# Patient Record
Sex: Female | Born: 1990 | Race: Black or African American | Hispanic: No | Marital: Single | Smoking: Current every day smoker
Health system: Southern US, Community
[De-identification: ages and names within clinical notes are randomized; demographics above are authoritative.]

## PROBLEM LIST (undated history)

## (undated) DIAGNOSIS — Z789 Other specified health status: Secondary | ICD-10-CM

## (undated) DIAGNOSIS — D219 Benign neoplasm of connective and other soft tissue, unspecified: Secondary | ICD-10-CM

## (undated) HISTORY — DX: Benign neoplasm of connective and other soft tissue, unspecified: D21.9

## (undated) HISTORY — PX: NO PAST SURGERIES: SHX2092

---
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
## 2008-02-22 ENCOUNTER — Emergency Department: Payer: Self-pay | Admitting: Emergency Medicine

## 2008-08-19 ENCOUNTER — Emergency Department: Payer: Self-pay | Admitting: Unknown Physician Specialty

## 2008-08-20 ENCOUNTER — Emergency Department: Payer: Self-pay | Admitting: Emergency Medicine

## 2009-02-13 ENCOUNTER — Emergency Department: Payer: Self-pay | Admitting: Emergency Medicine

## 2009-02-24 ENCOUNTER — Emergency Department: Payer: Self-pay | Admitting: Emergency Medicine

## 2009-03-27 ENCOUNTER — Emergency Department: Payer: Self-pay | Admitting: Emergency Medicine

## 2009-11-12 ENCOUNTER — Emergency Department: Payer: Self-pay | Admitting: Emergency Medicine

## 2010-03-30 ENCOUNTER — Emergency Department: Payer: Self-pay | Admitting: Unknown Physician Specialty

## 2010-08-28 ENCOUNTER — Emergency Department: Payer: Self-pay | Admitting: Internal Medicine

## 2010-10-22 ENCOUNTER — Emergency Department: Payer: Self-pay | Admitting: Emergency Medicine

## 2010-10-31 ENCOUNTER — Other Ambulatory Visit: Payer: Self-pay | Admitting: Family Medicine

## 2010-10-31 ENCOUNTER — Encounter (INDEPENDENT_AMBULATORY_CARE_PROVIDER_SITE_OTHER): Payer: Self-pay | Admitting: *Deleted

## 2010-10-31 DIAGNOSIS — Z3480 Encounter for supervision of other normal pregnancy, unspecified trimester: Secondary | ICD-10-CM

## 2010-10-31 DIAGNOSIS — O3680X0 Pregnancy with inconclusive fetal viability, not applicable or unspecified: Secondary | ICD-10-CM

## 2010-10-31 DIAGNOSIS — IMO0001 Reserved for inherently not codable concepts without codable children: Secondary | ICD-10-CM

## 2010-10-31 LAB — CONVERTED CEMR LAB
Eosinophils Absolute: 0.1 10*3/uL (ref 0.0–0.7)
Hepatitis B Surface Ag: NEGATIVE
Hgb A2 Quant: 2.5 % (ref 2.2–3.2)
Hgb F Quant: 0 % (ref 0.0–2.0)
Lymphocytes Relative: 21 % (ref 12–46)
Lymphs Abs: 2.1 10*3/uL (ref 0.7–4.0)
MCV: 93.1 fL (ref 78.0–100.0)
Neutro Abs: 7.4 10*3/uL (ref 1.7–7.7)
Neutrophils Relative %: 72 % (ref 43–77)
Platelets: 284 10*3/uL (ref 150–400)
Rh Type: POSITIVE
WBC: 10.2 10*3/uL (ref 4.0–10.5)

## 2010-11-05 ENCOUNTER — Other Ambulatory Visit: Payer: Self-pay | Admitting: Family Medicine

## 2010-11-05 ENCOUNTER — Ambulatory Visit (HOSPITAL_COMMUNITY)
Admission: RE | Admit: 2010-11-05 | Discharge: 2010-11-05 | Disposition: A | Discharge status: Home / Self Care | Payer: Medicaid Other | Source: Ambulatory Visit | Attending: Family Medicine | Admitting: Family Medicine

## 2010-11-05 ENCOUNTER — Encounter (HOSPITAL_COMMUNITY): Payer: Self-pay

## 2010-11-05 DIAGNOSIS — IMO0001 Reserved for inherently not codable concepts without codable children: Secondary | ICD-10-CM

## 2010-11-05 DIAGNOSIS — O3680X0 Pregnancy with inconclusive fetal viability, not applicable or unspecified: Secondary | ICD-10-CM

## 2010-11-05 DIAGNOSIS — Z3689 Encounter for other specified antenatal screening: Secondary | ICD-10-CM | POA: Insufficient documentation

## 2010-11-13 ENCOUNTER — Other Ambulatory Visit: Payer: Self-pay | Admitting: Obstetrics & Gynecology

## 2010-11-13 ENCOUNTER — Encounter: Payer: Self-pay | Admitting: Obstetrics and Gynecology

## 2010-11-13 ENCOUNTER — Other Ambulatory Visit (HOSPITAL_COMMUNITY)
Admission: RE | Admit: 2010-11-13 | Discharge: 2010-11-13 | Disposition: A | Discharge status: Home / Self Care | Payer: Medicaid Other | Source: Ambulatory Visit | Attending: Obstetrics & Gynecology | Admitting: Obstetrics & Gynecology

## 2010-11-13 DIAGNOSIS — Z3480 Encounter for supervision of other normal pregnancy, unspecified trimester: Secondary | ICD-10-CM

## 2010-11-13 DIAGNOSIS — Z3682 Encounter for antenatal screening for nuchal translucency: Secondary | ICD-10-CM

## 2010-11-13 DIAGNOSIS — Z01419 Encounter for gynecological examination (general) (routine) without abnormal findings: Secondary | ICD-10-CM | POA: Insufficient documentation

## 2010-11-13 DIAGNOSIS — Z113 Encounter for screening for infections with a predominantly sexual mode of transmission: Secondary | ICD-10-CM | POA: Insufficient documentation

## 2010-11-14 ENCOUNTER — Encounter: Payer: Self-pay | Admitting: Obstetrics & Gynecology

## 2010-11-14 LAB — CONVERTED CEMR LAB: Trich, Wet Prep: NONE SEEN

## 2010-11-28 ENCOUNTER — Ambulatory Visit (HOSPITAL_COMMUNITY): Payer: Medicaid Other

## 2010-12-11 ENCOUNTER — Encounter: Payer: Self-pay | Admitting: Obstetrics and Gynecology

## 2011-01-29 ENCOUNTER — Emergency Department: Payer: Self-pay | Admitting: Emergency Medicine

## 2011-02-22 ENCOUNTER — Emergency Department: Payer: Self-pay | Admitting: Emergency Medicine

## 2011-09-02 ENCOUNTER — Emergency Department: Payer: Self-pay | Admitting: *Deleted

## 2011-09-03 LAB — URINALYSIS, COMPLETE
Bilirubin,UR: NEGATIVE
Glucose,UR: NEGATIVE mg/dL (ref 0–75)
Ketone: NEGATIVE
Ph: 5 (ref 4.5–8.0)
RBC,UR: 3 /HPF (ref 0–5)
Squamous Epithelial: 4
WBC UR: 2 /HPF (ref 0–5)

## 2012-04-01 ENCOUNTER — Emergency Department: Payer: Self-pay | Admitting: Emergency Medicine

## 2012-04-06 ENCOUNTER — Emergency Department: Payer: Self-pay | Admitting: Emergency Medicine

## 2012-06-11 ENCOUNTER — Emergency Department: Payer: Self-pay | Admitting: Emergency Medicine

## 2012-08-30 ENCOUNTER — Emergency Department: Payer: Self-pay | Admitting: Emergency Medicine

## 2012-09-02 ENCOUNTER — Emergency Department: Payer: Self-pay | Admitting: Unknown Physician Specialty

## 2012-09-07 LAB — WOUND CULTURE

## 2013-07-13 ENCOUNTER — Emergency Department: Payer: Self-pay | Admitting: Emergency Medicine

## 2013-08-22 ENCOUNTER — Emergency Department: Payer: Self-pay | Admitting: Emergency Medicine

## 2013-08-23 LAB — CBC
HCT: 39.6 % (ref 35.0–47.0)
HGB: 13.4 g/dL (ref 12.0–16.0)
MCH: 31.6 pg (ref 26.0–34.0)
MCHC: 33.8 g/dL (ref 32.0–36.0)
MCV: 94 fL (ref 80–100)
Platelet: 284 10*3/uL (ref 150–440)
RBC: 4.23 10*6/uL (ref 3.80–5.20)
RDW: 13.5 % (ref 11.5–14.5)
WBC: 7.2 10*3/uL (ref 3.6–11.0)

## 2013-08-23 LAB — COMPREHENSIVE METABOLIC PANEL
Albumin: 3.7 g/dL (ref 3.4–5.0)
Alkaline Phosphatase: 83 U/L
Anion Gap: 3 — ABNORMAL LOW (ref 7–16)
BILIRUBIN TOTAL: 0.3 mg/dL (ref 0.2–1.0)
BUN: 13 mg/dL (ref 7–18)
CALCIUM: 8.8 mg/dL (ref 8.5–10.1)
CREATININE: 0.8 mg/dL (ref 0.60–1.30)
Chloride: 102 mmol/L (ref 98–107)
Co2: 30 mmol/L (ref 21–32)
EGFR (African American): >60
Glucose: 83 mg/dL (ref 65–99)
Osmolality: 269 (ref 275–301)
POTASSIUM: 3.4 mmol/L — AB (ref 3.5–5.1)
SGOT(AST): 20 U/L (ref 15–37)
SGPT (ALT): 22 U/L (ref 12–78)
Sodium: 135 mmol/L — ABNORMAL LOW (ref 136–145)
Total Protein: 7.4 g/dL (ref 6.4–8.2)

## 2013-08-23 LAB — TSH: Thyroid Stimulating Horm: 1.7 u[IU]/mL

## 2013-08-23 LAB — TROPONIN I

## 2013-08-23 LAB — CK TOTAL AND CKMB (NOT AT ARMC)
CK, TOTAL: 112 U/L (ref 21–215)
CK-MB: 1.5 ng/mL (ref 0.5–3.6)

## 2013-09-04 ENCOUNTER — Emergency Department: Payer: Self-pay | Admitting: Emergency Medicine

## 2013-09-06 ENCOUNTER — Emergency Department: Payer: Self-pay | Admitting: Emergency Medicine

## 2013-09-09 ENCOUNTER — Emergency Department: Payer: Self-pay | Admitting: Emergency Medicine

## 2013-10-23 ENCOUNTER — Emergency Department: Payer: Self-pay | Admitting: Emergency Medicine

## 2014-01-08 ENCOUNTER — Emergency Department: Payer: Self-pay | Admitting: Emergency Medicine

## 2014-01-09 ENCOUNTER — Emergency Department: Payer: Self-pay | Admitting: Emergency Medicine

## 2014-06-01 ENCOUNTER — Emergency Department: Payer: Self-pay | Admitting: Emergency Medicine

## 2014-06-01 LAB — CBC WITH DIFFERENTIAL/PLATELET
BASOS PCT: 0.3 %
Basophil #: 0 10*3/uL (ref 0.0–0.1)
EOS ABS: 0.1 10*3/uL (ref 0.0–0.7)
Eosinophil %: 0.5 %
HCT: 46.8 % (ref 35.0–47.0)
HGB: 15.6 g/dL (ref 12.0–16.0)
LYMPHS PCT: 15.7 %
Lymphocyte #: 1.6 10*3/uL (ref 1.0–3.6)
MCH: 33.3 pg (ref 26.0–34.0)
MCHC: 33.3 g/dL (ref 32.0–36.0)
MCV: 100 fL (ref 80–100)
MONO ABS: 0.8 x10 3/mm (ref 0.2–0.9)
Monocyte %: 8.1 %
NEUTROS PCT: 75.4 %
Neutrophil #: 7.8 10*3/uL — ABNORMAL HIGH (ref 1.4–6.5)
PLATELETS: 230 10*3/uL (ref 150–440)
RBC: 4.67 10*6/uL (ref 3.80–5.20)
RDW: 15.2 % — AB (ref 11.5–14.5)
WBC: 10.4 10*3/uL (ref 3.6–11.0)

## 2014-06-01 LAB — GC/CHLAMYDIA PROBE AMP

## 2014-06-01 LAB — URINALYSIS, COMPLETE
BACTERIA: NONE SEEN
BILIRUBIN, UR: NEGATIVE
Glucose,UR: NEGATIVE mg/dL (ref 0–75)
Leukocyte Esterase: NEGATIVE
Nitrite: NEGATIVE
Ph: 5 (ref 4.5–8.0)
Protein: NEGATIVE
RBC,UR: 4 /HPF (ref 0–5)
SPECIFIC GRAVITY: 1.025 (ref 1.003–1.030)
Squamous Epithelial: 10

## 2014-06-01 LAB — COMPREHENSIVE METABOLIC PANEL
ANION GAP: 7 (ref 7–16)
Albumin: 3.9 g/dL (ref 3.4–5.0)
Alkaline Phosphatase: 99 U/L
BUN: 11 mg/dL (ref 7–18)
Bilirubin,Total: 0.6 mg/dL (ref 0.2–1.0)
CALCIUM: 9.4 mg/dL (ref 8.5–10.1)
Chloride: 102 mmol/L (ref 98–107)
Co2: 29 mmol/L (ref 21–32)
Creatinine: 0.95 mg/dL (ref 0.60–1.30)
EGFR (African American): >60
Glucose: 78 mg/dL (ref 65–99)
OSMOLALITY: 274 (ref 275–301)
Potassium: 3.5 mmol/L (ref 3.5–5.1)
SGOT(AST): 29 U/L (ref 15–37)
SGPT (ALT): 29 U/L
Sodium: 138 mmol/L (ref 136–145)
TOTAL PROTEIN: 8.3 g/dL — AB (ref 6.4–8.2)

## 2014-06-01 LAB — WET PREP, GENITAL

## 2014-06-01 LAB — LIPASE, BLOOD: Lipase: 80 U/L (ref 73–393)

## 2014-06-20 ENCOUNTER — Encounter (HOSPITAL_COMMUNITY): Payer: Self-pay
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
## 2017-04-08 ENCOUNTER — Emergency Department
Admission: EM | Admit: 2017-04-08 | Discharge: 2017-04-08 | Disposition: A | Discharge status: Home / Self Care | Payer: Self-pay | Attending: Emergency Medicine | Admitting: Emergency Medicine

## 2017-04-08 ENCOUNTER — Emergency Department: Payer: Self-pay

## 2017-04-08 ENCOUNTER — Encounter: Payer: Self-pay | Admitting: Emergency Medicine

## 2017-04-08 DIAGNOSIS — R102 Pelvic and perineal pain: Secondary | ICD-10-CM | POA: Insufficient documentation

## 2017-04-08 DIAGNOSIS — F17210 Nicotine dependence, cigarettes, uncomplicated: Secondary | ICD-10-CM | POA: Insufficient documentation

## 2017-04-08 DIAGNOSIS — N946 Dysmenorrhea, unspecified: Secondary | ICD-10-CM | POA: Insufficient documentation

## 2017-04-08 LAB — COMPREHENSIVE METABOLIC PANEL
ALK PHOS: 89 U/L (ref 38–126)
ALT: 19 U/L (ref 14–54)
AST: 20 U/L (ref 15–41)
Albumin: 4.3 g/dL (ref 3.5–5.0)
Anion gap: 7 (ref 5–15)
BUN: 13 mg/dL (ref 6–20)
CALCIUM: 9.3 mg/dL (ref 8.9–10.3)
CO2: 26 mmol/L (ref 22–32)
CREATININE: 1.01 mg/dL — AB (ref 0.44–1.00)
Chloride: 103 mmol/L (ref 101–111)
GFR calc non Af Amer: >60 mL/min (ref >60)
GLUCOSE: 91 mg/dL (ref 65–99)
Potassium: 3.8 mmol/L (ref 3.5–5.1)
SODIUM: 136 mmol/L (ref 135–145)
Total Bilirubin: 0.4 mg/dL (ref 0.3–1.2)
Total Protein: 7.8 g/dL (ref 6.5–8.1)

## 2017-04-08 LAB — CBC
HCT: 39.6 % (ref 35.0–47.0)
Hemoglobin: 13.7 g/dL (ref 12.0–16.0)
MCH: 31.3 pg (ref 26.0–34.0)
MCHC: 34.5 g/dL (ref 32.0–36.0)
MCV: 90.6 fL (ref 80.0–100.0)
PLATELETS: 322 10*3/uL (ref 150–440)
RBC: 4.37 MIL/uL (ref 3.80–5.20)
RDW: 13.3 % (ref 11.5–14.5)
WBC: 8.3 10*3/uL (ref 3.6–11.0)

## 2017-04-08 LAB — URINALYSIS, COMPLETE (UACMP) WITH MICROSCOPIC
Bacteria, UA: NONE SEEN
Bilirubin Urine: NEGATIVE
GLUCOSE, UA: NEGATIVE mg/dL
Hgb urine dipstick: NEGATIVE
Ketones, ur: NEGATIVE mg/dL
Leukocytes, UA: NEGATIVE
Nitrite: NEGATIVE
PH: 5 (ref 5.0–8.0)
Protein, ur: NEGATIVE mg/dL
RBC / HPF: NONE SEEN RBC/hpf (ref 0–5)
SPECIFIC GRAVITY, URINE: 1.024 (ref 1.005–1.030)

## 2017-04-08 LAB — WET PREP, GENITAL
CLUE CELLS WET PREP: NONE SEEN
Sperm: NONE SEEN
TRICH WET PREP: NONE SEEN
Yeast Wet Prep HPF POC: NONE SEEN

## 2017-04-08 LAB — CHLAMYDIA/NGC RT PCR (ARMC ONLY)
CHLAMYDIA TR: NOT DETECTED
N GONORRHOEAE: NOT DETECTED

## 2017-04-08 LAB — POCT PREGNANCY, URINE: Preg Test, Ur: NEGATIVE

## 2017-04-08 LAB — LIPASE, BLOOD: Lipase: 21 U/L (ref 11–51)

## 2017-04-08 MED ORDER — NAPROXEN 375 MG PO TABS: 375.0000 mg | ORAL_TABLET | Freq: Two times a day (BID) | ORAL | 0 refills | Status: AC

## 2017-04-08 NOTE — ED Triage Notes (Signed)
Pt c/o lower abdominal pain x3 days. Pt denies urinary symptoms, N/V. Pt sts "It feels like p[eriod cramps, but im not on my period."

## 2017-04-08 NOTE — Discharge Instructions (Signed)
Return to the emergency room for any new or worrisome symptoms including increased pain, vomiting, lightheadedness, fever or you feel worse in any way. Follow closely with OB/GYN

## 2017-04-08 NOTE — ED Provider Notes (Signed)
[facility] Emergency Department Provider Note  ____________________________________________   I have reviewed the triage vital signs and the nursing notes.   HISTORY  Chief Complaint Abdominal Pain    HPI Jill Kelly is a 26 y.o. female who presents today complaining of pelvic discomfort. Patient states the last 3 or 4 months she has had increased bleeding and increased pain with her menstrual periods. She just finished her menstrual. This still is having cramping. Not lateralizing. Sometimes the pain comes before and after the period which is new for her. She states that she has not had any vomiting or fever or vaginal discharge or STI symptoms. She states that the pain is a crampy discomfort like a menstrual. However it just lasted longer than it normally does or starts earlier than he normally does. However, she also states that her periods are starting and fishing at the normal time period. She has been having slightly heavier periods as well. She has not yet followed up with OB/GYN for this. She does not wish any pain medications but she wants to make sure that there is no obvious cause for this prior to following up with OB  History reviewed. No pertinent past medical history.  There are no active problems to display for this patient.   History reviewed. No pertinent surgical history.  Prior to Admission medications   Not on File    Allergies Patient has no known allergies.  History reviewed. No pertinent family history.  Social History Social History  Substance Use Topics  . Smoking status: Current Every Day Smoker  . Smokeless tobacco: Never Used  . Alcohol use Yes    Review of Systems Constitutional: No fever/chills Eyes: No visual changes. ENT: No sore throat. No stiff neck no neck pain Cardiovascular: Denies chest pain. Respiratory: Denies shortness of breath. Gastrointestinal:   no vomiting.  No diarrhea.  No  constipation. Genitourinary: Negative for dysuria. Musculoskeletal: Negative lower extremity swelling Skin: Negative for rash. Neurological: Negative for severe headaches, focal weakness or numbness.   ____________________________________________   PHYSICAL EXAM:  VITAL SIGNS: ED Triage Vitals  Enc Vitals Group     BP 04/08/17 1908 128/88     Pulse Rate 04/08/17 1908 88     Resp 04/08/17 1908 16     Temp 04/08/17 1908 98 F (36.7 C)     Temp Source 04/08/17 1908 Oral     SpO2 04/08/17 1908 99 %     Weight 04/08/17 1909 200 lb (90.7 kg)     Height 04/08/17 1909 5\' 2"  (1.575 m)     Head Circumference --      Peak Flow --      Pain Score 04/08/17 1933 8     Pain Loc --      Pain Edu? --      Excl. in [place]? --     Constitutional: Alert and oriented. Well appearing and in no acute distress. Eyes: Conjunctivae are normal Head: Atraumatic HEENT: No congestion/rhinnorhea. Mucous membranes are moist.  Oropharynx non-erythematous Neck:   Nontender with no meningismus, no masses, no stridor Cardiovascular: Normal rate, regular rhythm. Grossly normal heart sounds.  Good peripheral circulation. Respiratory: Normal respiratory effort.  No retractions. Lungs CTAB. Abdominal: Soft and nontender. No distention. No guarding no rebound Back:  There is no focal tenderness or step off.  there is no midline tenderness there are no lesions noted. there is no CVA tenderness Pelvic exam: Female nurse chaperone present, no  external lesions noted, physiologic vaginal discharge noted with no purulent discharge, no cervical motion tenderness, no adnexal tenderness or mass, there is no significant uterine tenderness or mass. No vaginal bleeding Musculoskeletal: No lower extremity tenderness, no upper extremity tenderness. No joint effusions, no DVT signs strong distal pulses no edema Neurologic:  Normal speech and language. No gross focal neurologic deficits are appreciated.  Skin:  Skin is warm, dry and  intact. No rash noted. Psychiatric: Mood and affect are normal. Speech and behavior are normal.  ____________________________________________   LABS (all labs ordered are listed, but only abnormal results are displayed)  Labs Reviewed  COMPREHENSIVE METABOLIC PANEL - Abnormal; Notable for the following:       Result Value   Creatinine, Ser 1.01 (*)    All other components within normal limits  URINALYSIS, COMPLETE (UACMP) WITH MICROSCOPIC - Abnormal; Notable for the following:    Color, Urine YELLOW (*)    APPearance CLEAR (*)    Squamous Epithelial / LPF 0-5 (*)    All other components within normal limits  CHLAMYDIA/NGC RT PCR ([facility] ONLY)  WET PREP, GENITAL  LIPASE, BLOOD  CBC  POC URINE PREG, ED  POCT PREGNANCY, URINE   ____________________________________________  EKG  I personally interpreted any EKGs ordered by me or triage  ____________________________________________  RADIOLOGY  I reviewed any imaging ordered by me or triage that were performed during my shift and, if possible, patient and/or family made aware of any abnormal findings. ____________________________________________   PROCEDURES  Procedure(s) performed: None  Procedures  Critical Care performed: None  ____________________________________________   INITIAL IMPRESSION / ASSESSMENT AND PLAN / ED COURSE  Pertinent labs & imaging results that were available during my care of the patient were reviewed by me and considered in my medical decision making (see chart for details).  Patient with heavier periods that last longer than normal over the last several months. Not on any birth control not pregnant, blood work is reassuring exam is reassuring vitals are reassuring ultrasound is reassuring, I feel the patient is safe for close outpatient follow-up with OB which we will recommend. Gonorrhea chlamydia and wet prep have been obtained low suspicion for STI for these symptoms. However, she  understands the need to follow-up with medical records for results.    ____________________________________________   FINAL CLINICAL IMPRESSION(S) / ED DIAGNOSES  Final diagnoses:  Pelvic pain      This chart was dictated using voice recognition software.  Despite best efforts to proofread,  errors can occur which can change meaning.      Schuyler Amor, MD 04/08/17 2117

## 2018-11-24 ENCOUNTER — Other Ambulatory Visit: Payer: Self-pay

## 2018-11-24 ENCOUNTER — Emergency Department
Admission: EM | Admit: 2018-11-24 | Discharge: 2018-11-25 | Disposition: A | Discharge status: Home / Self Care | Payer: Self-pay | Attending: Emergency Medicine | Admitting: Emergency Medicine

## 2018-11-24 ENCOUNTER — Encounter: Payer: Self-pay | Admitting: Emergency Medicine

## 2018-11-24 DIAGNOSIS — K5900 Constipation, unspecified: Secondary | ICD-10-CM

## 2018-11-24 DIAGNOSIS — R1084 Generalized abdominal pain: Secondary | ICD-10-CM

## 2018-11-24 DIAGNOSIS — F17200 Nicotine dependence, unspecified, uncomplicated: Secondary | ICD-10-CM | POA: Insufficient documentation

## 2018-11-24 DIAGNOSIS — R110 Nausea: Secondary | ICD-10-CM | POA: Insufficient documentation

## 2018-11-24 LAB — COMPREHENSIVE METABOLIC PANEL
ALT: 26 U/L (ref 0–44)
AST: 27 U/L (ref 15–41)
Albumin: 4.3 g/dL (ref 3.5–5.0)
Alkaline Phosphatase: 89 U/L (ref 38–126)
Anion gap: 7 (ref 5–15)
BUN: 13 mg/dL (ref 6–20)
CO2: 28 mmol/L (ref 22–32)
Calcium: 9.8 mg/dL (ref 8.9–10.3)
Chloride: 105 mmol/L (ref 98–111)
Creatinine, Ser: 0.67 mg/dL (ref 0.44–1.00)
GFR calc Af Amer: >60 mL/min (ref >60)
GFR calc non Af Amer: >60 mL/min (ref >60)
Glucose, Bld: 95 mg/dL (ref 70–99)
Potassium: 4.3 mmol/L (ref 3.5–5.1)
Sodium: 140 mmol/L (ref 135–145)
Total Bilirubin: 0.5 mg/dL (ref 0.3–1.2)
Total Protein: 8 g/dL (ref 6.5–8.1)

## 2018-11-24 LAB — URINALYSIS, COMPLETE (UACMP) WITH MICROSCOPIC
Bacteria, UA: NONE SEEN
Bilirubin Urine: NEGATIVE
Glucose, UA: NEGATIVE mg/dL
Hgb urine dipstick: NEGATIVE
Ketones, ur: NEGATIVE mg/dL
Nitrite: NEGATIVE
Protein, ur: NEGATIVE mg/dL
Specific Gravity, Urine: 1.027 (ref 1.005–1.030)
pH: 5 (ref 5.0–8.0)

## 2018-11-24 LAB — CBC WITH DIFFERENTIAL/PLATELET
Abs Immature Granulocytes: 0.02 10*3/uL (ref 0.00–0.07)
Basophils Absolute: 0 10*3/uL (ref 0.0–0.1)
Basophils Relative: 0 %
Eosinophils Absolute: 0.2 10*3/uL (ref 0.0–0.5)
Eosinophils Relative: 2 %
HCT: 40.8 % (ref 36.0–46.0)
Hemoglobin: 13.3 g/dL (ref 12.0–15.0)
Immature Granulocytes: 0 %
Lymphocytes Relative: 25 %
Lymphs Abs: 2.4 10*3/uL (ref 0.7–4.0)
MCH: 31.1 pg (ref 26.0–34.0)
MCHC: 32.6 g/dL (ref 30.0–36.0)
MCV: 95.3 fL (ref 80.0–100.0)
Monocytes Absolute: 0.6 10*3/uL (ref 0.1–1.0)
Monocytes Relative: 6 %
Neutro Abs: 6.6 10*3/uL (ref 1.7–7.7)
Neutrophils Relative %: 67 %
Platelets: 288 10*3/uL (ref 150–400)
RBC: 4.28 MIL/uL (ref 3.87–5.11)
RDW: 13 % (ref 11.5–15.5)
WBC: 9.8 10*3/uL (ref 4.0–10.5)
nRBC: 0 % (ref 0.0–0.2)

## 2018-11-24 LAB — LIPASE, BLOOD: Lipase: 22 U/L (ref 11–51)

## 2018-11-24 LAB — POCT PREGNANCY, URINE: Preg Test, Ur: NEGATIVE

## 2018-11-24 NOTE — ED Triage Notes (Signed)
Patient ambulatory to triage with steady gait, without difficulty or distress noted; pt reports left lower abd cramping x  3 days accomp by nausea; st took dulcolax with good relief but pain persists

## 2018-11-25 ENCOUNTER — Emergency Department: Payer: Self-pay

## 2018-11-25 MED ORDER — IOHEXOL 300 MG/ML  SOLN
100.0000 mL | Freq: Once | INTRAMUSCULAR | Status: AC | PRN
  Administered 2018-11-25: 100 mL via INTRAVENOUS

## 2018-11-25 MED ORDER — IOHEXOL 240 MG/ML SOLN
50.0000 mL | Freq: Once | INTRAMUSCULAR | Status: AC | PRN
  Administered 2018-11-25: 50 mL via ORAL

## 2018-11-25 MED ORDER — IOPAMIDOL (ISOVUE-300) INJECTION 61%: 30.0000 mL | Freq: Once | INTRAVENOUS | Status: DC | PRN

## 2018-11-25 NOTE — ED Notes (Signed)
Patient transported to CT 

## 2018-11-25 NOTE — Discharge Instructions (Signed)
You were seen in the emergency department today for abdominal pain that we think is likely due to constipation.  We recommend that you use one or more of the following over-the-counter medications in the order described:   1)  Colace (or Dulcolax) 100 mg:  This is a stool softener, and you may take it once or twice a day as needed. 2)  Senna tablets:  This is a bowel stimulant that will help "push" out your stool. It is the next step to add after you have tried a stool softener. 3)  Miralax (powder):  This medication works by drawing additional fluid into your intestines and helps to flush out your stool.  Mix the powder with water or juice according to label instructions.  It may help if the Colace and Senna are not sufficient, but you must be sure to use the recommended amount of water or juice when you mix up the powder. 4)  Look for magnesium citrate at the pharmacy (it is usually a small glass bottle).  Drink the bottle according to the label instructions.  Remember that narcotic pain medications are constipating, so avoid them or minimize their use.  Drink plenty of fluids.  Please return to the Emergency Department immediately if you develop new or worsening symptoms that concern you, such as (but not limited to) fever > 101 degrees, severe abdominal pain, or persistent vomiting.

## 2018-11-25 NOTE — ED Provider Notes (Signed)
[facility] Emergency Department Provider Note  ____________________________________________   First MD Initiated Contact with Patient 11/24/18 2345     (approximate)  I have reviewed the triage vital signs and the nursing notes.   HISTORY  Chief Complaint Abdominal Pain    HPI Jill Kelly is a 28 y.o. female with medical history as listed below who presents for evaluation of intermittent but recurrent left lower quadrant abdominal pain over the last 3 days.  It is accompanied with nausea but no vomiting.  She has been constipated with only one bowel movement in about the last for 5 days and that was only after taking Dulcolax.  She normally has a bowel movement every day.  She says the pain is severe at times and makes it impossible for her to continue on with her daily activities.  She has had similar problems in the past but was not given a specific diagnosis.  She does not normally have acid reflux but she was given some famotidine by her mother prior to coming to the emergency department and she is feeling a little bit better now but still has occasional pains in the left lower quadrant.  She has only 1 sexual partner and is not at all concerned about STDs with no vaginal discharge or discomfort.  Her last menstrual period was a couple weeks ago.  She denies fever/chills, chest pain, shortness of breath and has not been in contact with anyone with COVID-19 and has not done any high risk travel.         History reviewed. No pertinent past medical history.  There are no active problems to display for this patient.   History reviewed. No pertinent surgical history.  Prior to Admission medications   Not on File    Allergies Patient has no known allergies.  No family history on file.  Social History Social History   Tobacco Use   Smoking status: Current Every Day Smoker   Smokeless tobacco: Never Used  Substance Use Topics   Alcohol  use: Yes   Drug use: No    Review of Systems Constitutional: No fever/chills Eyes: No visual changes. ENT: No sore throat. Cardiovascular: Denies chest pain. Respiratory: Denies shortness of breath. Gastrointestinal: Left lower quadrant abdominal pain with nausea but no vomiting.  Positive for constipation. Genitourinary: Negative for dysuria. Musculoskeletal: Negative for neck pain.  Negative for back pain. Integumentary: Negative for rash. Neurological: Negative for headaches, focal weakness or numbness.   ____________________________________________   PHYSICAL EXAM:  VITAL SIGNS: ED Triage Vitals  Enc Vitals Group     BP 11/24/18 2104 120/80     Pulse Rate 11/24/18 2104 86     Resp 11/24/18 2104 18     Temp 11/24/18 2104 98.1 F (36.7 C)     Temp Source 11/24/18 2104 Oral     SpO2 11/24/18 2104 100 %     Weight 11/24/18 2102 97.5 kg (215 lb)     Height 11/24/18 2102 1.575 m (5\' 2" )     Head Circumference --      Peak Flow --      Pain Score 11/24/18 2102 10     Pain Loc --      Pain Edu? --      Excl. in [place]? --     Constitutional: Alert and oriented. Well appearing and in no acute distress. Eyes: Conjunctivae are normal.  Head: Atraumatic. Nose: No congestion/rhinnorhea. Mouth/Throat: Mucous membranes are moist. Neck:  No stridor.  No meningeal signs.   Cardiovascular: Normal rate, regular rhythm. Good peripheral circulation. Grossly normal heart sounds. Respiratory: Normal respiratory effort.  No retractions. Lungs CTAB. Gastrointestinal: Obese, soft and nondistended.  Some mild tenderness to palpation in the left lower quadrant. Genitourinary: Deferred with joint decision-making between the patient and myself. Musculoskeletal: No lower extremity tenderness nor edema. No gross deformities of extremities. Neurologic:  Normal speech and language. No gross focal neurologic deficits are appreciated.  Skin:  Skin is warm, dry and intact. No rash  noted. Psychiatric: Mood and affect are normal. Speech and behavior are normal.  ____________________________________________   LABS (all labs ordered are listed, but only abnormal results are displayed)  Labs Reviewed  URINALYSIS, COMPLETE (UACMP) WITH MICROSCOPIC - Abnormal; Notable for the following components:      Result Value   Color, Urine YELLOW (*)    APPearance HAZY (*)    Leukocytes,Ua SMALL (*)    All other components within normal limits  CBC WITH DIFFERENTIAL/PLATELET  COMPREHENSIVE METABOLIC PANEL  LIPASE, BLOOD  POCT PREGNANCY, URINE   ____________________________________________  EKG  None - EKG not ordered by ED physician ____________________________________________  RADIOLOGY   ED MD interpretation: Moderate stool burden in the proximal colon, otherwise unremarkable.  Official radiology report(s): Ct Abdomen Pelvis W Contrast  Result Date: 11/25/2018 CLINICAL DATA:  Left lower quadrant pain. Nausea. Vomiting. EXAM: CT ABDOMEN AND PELVIS WITH CONTRAST TECHNIQUE: Multidetector CT imaging of the abdomen and pelvis was performed using the standard protocol following bolus administration of intravenous contrast. CONTRAST:  182mL OMNIPAQUE IOHEXOL 300 MG/ML  SOLN COMPARISON:  None. FINDINGS: Lower chest: No acute abnormality. Hepatobiliary: Subcentimeter low-density in the subcapsular right lobe of the liver is incompletely characterize, likely small cyst or hemangioma. No gallstones, gallbladder wall thickening, or biliary dilatation. Pancreas: No ductal dilatation or inflammation. Spleen: Normal in size without focal abnormality. Adrenals/Urinary Tract: Normal adrenal glands. No hydronephrosis or perinephric edema. Homogeneous renal enhancement. Minimal cortical scarring in the lower right kidney. Urinary bladder is physiologically distended without wall thickening. Stomach/Bowel: Stomach is within normal limits. Appendix appears normal, image 43 series 5. No evidence  of bowel wall thickening, distention, or inflammatory changes. Few colonic diverticula in the distal descending and sigmoid colon without diverticulitis. Moderate stool in the proximal colon. Distal colon is decompressed. Vascular/Lymphatic: No significant vascular findings are present. No enlarged abdominal or pelvic lymph nodes. Reproductive: Slight heterogeneous enhancement of the uterus without focal fibroid. Ovaries symmetric and unremarkable. Other: Small amount of free fluid in the pelvis. No free air or intra-abdominal abscess. Multiple pelvic phleboliths. Small fat containing umbilical hernia. Musculoskeletal: There are no acute or suspicious osseous abnormalities. Hemi transitional lumbosacral anatomy. IMPRESSION: 1. No acute abnormality or explanation for abdominal pain. 2. Minimal distal descending and sigmoid colonic diverticulosis without diverticulitis. Electronically Signed   By: Keith Rake M.D.   On: 11/25/2018 02:56    ____________________________________________   PROCEDURES   Procedure(s) performed (including Critical Care):  Procedures   ____________________________________________   INITIAL IMPRESSION / MDM / [place] / ED COURSE  As part of my medical decision making, I reviewed the following data within the [place] notes reviewed and incorporated, Labs reviewed , Old chart reviewed, Notes from prior ED visits and [place] Controlled Substance Database  HADEN CAVENAUGH was evaluated in Emergency Department on 11/25/2018 for the symptoms described in the history of present illness. She was evaluated in the context of the [place] COVID-19  pandemic, which necessitated consideration that the patient might be at risk for infection with the SARS-CoV-2 virus that causes COVID-19. Institutional protocols and algorithms that pertain to the evaluation of patients at risk for COVID-19 are in a state of rapid change based on information released by  regulatory bodies including the CDC and federal and state organizations. These policies and algorithms were followed during the patient's care in the ED.      Differential diagnosis includes, but is not limited to, constipation, diverticulitis, appendicitis, PID/STD, UTI, acid reflux, musculoskeletal pain/strain.  The patient is in no distress at this time and has only intermittent symptoms for the last few days.  Her vital signs are stable and she does not meet sepsis criteria.  Her lab work is all within normal limits.  However she could still have a degree of diverticulitis and reports that at times her pain is severe and makes it incapable of her doing anything else.  I suspect constipation is the primary cause and we discussed this but she says she really wants to know what is going on because the pain is excruciating.  I explained that we could obtain a CT scan of the abdomen and pelvis with oral and IV contrast (the oral contrast will also help as a laxative) and she is in agreement with the plan.  We discussed the possibility of doing a pelvic exam but neither of [place] think that it would be very beneficial and does not explain the symptoms she is having.  I will reassess after the results of the CT scan.  Clinical Course as of Nov 24 720  Wed Nov 25, 2018  0318 CT scan is reassuring.  There is a moderate amount of stool in the proximal colon and I think that is likely the cause of her discomfort.  No sign of acute infection or obstruction.  I gave the patient the results and she understands and agrees with the plan.  I gave my usual and customary return precautions.  CT ABDOMEN PELVIS W CONTRAST [CF]    Clinical Course User Index [CF] Hinda Kehr, MD    ____________________________________________  FINAL CLINICAL IMPRESSION(S) / ED DIAGNOSES  Final diagnoses:  Generalized abdominal pain  Constipation, unspecified constipation type     MEDICATIONS GIVEN DURING THIS  VISIT:  Medications  iohexol (OMNIPAQUE) 240 MG/ML injection 50 mL (50 mLs Oral Contrast Given 11/25/18 0045)  iohexol (OMNIPAQUE) 300 MG/ML solution 100 mL (100 mLs Intravenous Contrast Given 11/25/18 0143)     ED Discharge Orders    None       Note:  This document was prepared using Dragon voice recognition software and may include unintentional dictation errors.   Hinda Kehr, MD 11/25/18 [phone]

## 2018-12-10 ENCOUNTER — Other Ambulatory Visit: Payer: Self-pay

## 2018-12-10 ENCOUNTER — Encounter: Payer: Self-pay | Admitting: Emergency Medicine

## 2018-12-10 ENCOUNTER — Emergency Department
Admission: EM | Admit: 2018-12-10 | Discharge: 2018-12-10 | Disposition: A | Discharge status: Home / Self Care | Payer: Self-pay | Attending: Emergency Medicine | Admitting: Emergency Medicine

## 2018-12-10 DIAGNOSIS — F17210 Nicotine dependence, cigarettes, uncomplicated: Secondary | ICD-10-CM | POA: Insufficient documentation

## 2018-12-10 DIAGNOSIS — J039 Acute tonsillitis, unspecified: Secondary | ICD-10-CM | POA: Insufficient documentation

## 2018-12-10 LAB — GROUP A STREP BY PCR: Group A Strep by PCR: NOT DETECTED

## 2018-12-10 MED ORDER — IBUPROFEN 100 MG/5ML PO SUSP
800.0000 mg | Freq: Once | ORAL | Status: AC
  Administered 2018-12-10: 05:00:00 800 mg via ORAL
  Filled 2018-12-10: qty 40

## 2018-12-10 MED ORDER — LIDOCAINE VISCOUS HCL 2 % MT SOLN
15.0000 mL | Freq: Once | OROMUCOSAL | Status: AC
  Administered 2018-12-10: 05:00:00 15 mL via OROMUCOSAL
  Filled 2018-12-10: qty 15

## 2018-12-10 MED ORDER — AMOXICILLIN-POT CLAVULANATE 875-125 MG PO TABS
1.0000 | ORAL_TABLET | Freq: Once | ORAL | Status: AC
  Administered 2018-12-10: 07:00:00 1 via ORAL
  Filled 2018-12-10: qty 1

## 2018-12-10 MED ORDER — AMOXICILLIN-POT CLAVULANATE 875-125 MG PO TABS: 1.0000 | ORAL_TABLET | Freq: Two times a day (BID) | ORAL | 0 refills | Status: AC

## 2018-12-10 NOTE — ED Triage Notes (Signed)
Patient ambulatory to triage with steady gait, without difficulty or distress noted; pt reports sore throat since last night; denies any accomp symptoms

## 2018-12-10 NOTE — ED Provider Notes (Signed)
[facility] Emergency Department Provider Note    First MD Initiated Contact with Patient 12/10/18 [phone]     (approximate)  I have reviewed the triage vital signs and the nursing notes.   HISTORY  Chief Complaint Sore Throat   HPI Jill Kelly is a 28 y.o. female with medical history as listed below presents with sore throat last night.  Patient denies any accompanying fever.  Patient denies any nausea or vomiting.  Patient does admit to pain with swallowing.  No known sick contact no recent travel.        History reviewed. No pertinent past medical history.  There are no active problems to display for this patient.   History reviewed. No pertinent surgical history.  Prior to Admission medications   Not on File    Allergies Patient has no known allergies.  No family history on file.  Social History Social History   Tobacco Use  . Smoking status: Current Every Day Smoker  . Smokeless tobacco: Never Used  Substance Use Topics  . Alcohol use: Yes  . Drug use: No    Review of Systems Constitutional: No fever/chills Eyes: No visual changes. ENT: Positive for sore throat. Cardiovascular: Denies chest pain. Respiratory: Denies shortness of breath. Gastrointestinal: No abdominal pain.  No nausea, no vomiting.  No diarrhea.  No constipation. Genitourinary: Negative for dysuria. Musculoskeletal: Negative for neck pain.  Negative for back pain. Integumentary: Negative for rash. Neurological: Negative for headaches, focal weakness or numbness.  ____________________________________________   PHYSICAL EXAM:  VITAL SIGNS: ED Triage Vitals  Enc Vitals Group     BP 12/10/18 0430 (!) 142/60     Pulse Rate 12/10/18 0430 93     Resp 12/10/18 0430 20     Temp 12/10/18 0430 98.4 F (36.9 C)     Temp Source 12/10/18 0430 Oral     SpO2 12/10/18 0430 100 %     Weight 12/10/18 0415 90.7 kg (200 lb)     Height 12/10/18 0415 1.778 m (5'  10")     Head Circumference --      Peak Flow --      Pain Score 12/10/18 0415 10     Pain Loc --      Pain Edu? --      Excl. in [place]? --     Constitutional: Alert and oriented. Well appearing and in no acute distress. Eyes: Conjunctivae are normal. Head: Atraumatic. Ears:  Healthy appearing ear canals and TMs bilaterally Nose: No congestion/rhinnorhea. Mouth/Throat: Mucous membranes are moist.  Pharyngeal erythema  Neck: No stridor.  Cardiovascular: Normal rate, regular rhythm. Good peripheral circulation. Grossly normal heart sounds. Respiratory: Normal respiratory effort.  No retractions. No audible wheezing. Gastrointestinal: Soft and nontender. No distention.  Musculoskeletal: No lower extremity tenderness nor edema. No gross deformities of extremities. Neurologic:  Normal speech and language. No gross focal neurologic deficits are appreciated.  Skin:  Skin is warm, dry and intact. No rash noted. Psychiatric: Mood and affect are normal. Speech and behavior are normal.  ____________________________________________   LABS (all labs ordered are listed, but only abnormal results are displayed)  Labs Reviewed  GROUP A STREP BY PCR    Procedures   ____________________________________________   INITIAL IMPRESSION / MDM / [place] / ED COURSE  As part of my medical decision making, I reviewed the following data within the electronic MEDICAL RECORD NUMBER   28 year old female presenting with above-stated history and physical  exam concerning for tonsillitis/pharyngitis.  No clinical evidence of parapharyngeal abscess.  Group A strep negative patient given Augmentin and ibuprofen after gargling with viscous lidocaine here in the emergency department.  Patient will be prescribed Augmentin for home advised the patient regarding warning signs at warrant immediate return to the emergency department.  Jill Kelly was evaluated in Emergency Department on 12/10/2018 for the  symptoms described in the history of present illness. She was evaluated in the context of the global COVID-19 pandemic, which necessitated consideration that the patient might be at risk for infection with the SARS-CoV-2 virus that causes COVID-19. Institutional protocols and algorithms that pertain to the evaluation of patients at risk for COVID-19 are in a state of rapid change based on information released by regulatory bodies including the CDC and federal and state organizations. These policies and algorithms were followed during the patient's care in the ED.    ____________________________________________  FINAL CLINICAL IMPRESSION(S) / ED DIAGNOSES  Final diagnoses:  Tonsillitis     MEDICATIONS GIVEN DURING THIS VISIT:  Medications  amoxicillin-clavulanate (AUGMENTIN) 875-125 MG per tablet 1 tablet (has no administration in time range)  lidocaine (XYLOCAINE) 2 % viscous mouth solution 15 mL (15 mLs Mouth/Throat Given 12/10/18 0506)  ibuprofen (ADVIL) 100 MG/5ML suspension 800 mg (800 mg Oral Given 12/10/18 2897)     ED Discharge Orders    None       Note:  This document was prepared using Dragon voice recognition software and may include unintentional dictation errors.   Gregor Hams, MD 12/10/18 [phone]

## 2018-12-10 NOTE — ED Notes (Signed)
Pt verbalized understanding of d/c instructions, RX, and f/u care. No further questions at this time. Pt ambulatory to the exit with steady gait  

## 2019-08-05 ENCOUNTER — Other Ambulatory Visit: Payer: Self-pay

## 2019-08-05 ENCOUNTER — Emergency Department
Admission: EM | Admit: 2019-08-05 | Discharge: 2019-08-05 | Disposition: A | Discharge status: Home / Self Care | Payer: Self-pay | Attending: Emergency Medicine | Admitting: Emergency Medicine

## 2019-08-05 ENCOUNTER — Encounter: Payer: Self-pay | Admitting: Emergency Medicine

## 2019-08-05 DIAGNOSIS — Z5321 Procedure and treatment not carried out due to patient leaving prior to being seen by health care provider: Secondary | ICD-10-CM | POA: Insufficient documentation

## 2019-08-05 DIAGNOSIS — R1032 Left lower quadrant pain: Secondary | ICD-10-CM | POA: Insufficient documentation

## 2019-08-05 LAB — CBC
HCT: 38.9 % (ref 36.0–46.0)
Hemoglobin: 12.9 g/dL (ref 12.0–15.0)
MCH: 30.6 pg (ref 26.0–34.0)
MCHC: 33.2 g/dL (ref 30.0–36.0)
MCV: 92.4 fL (ref 80.0–100.0)
Platelets: 340 10*3/uL (ref 150–400)
RBC: 4.21 MIL/uL (ref 3.87–5.11)
RDW: 13.1 % (ref 11.5–15.5)
WBC: 8.9 10*3/uL (ref 4.0–10.5)
nRBC: 0 % (ref 0.0–0.2)

## 2019-08-05 LAB — COMPREHENSIVE METABOLIC PANEL
ALT: 20 U/L (ref 0–44)
AST: 18 U/L (ref 15–41)
Albumin: 3.7 g/dL (ref 3.5–5.0)
Alkaline Phosphatase: 82 U/L (ref 38–126)
Anion gap: 9 (ref 5–15)
BUN: 11 mg/dL (ref 6–20)
CO2: 25 mmol/L (ref 22–32)
Calcium: 9.3 mg/dL (ref 8.9–10.3)
Chloride: 104 mmol/L (ref 98–111)
Creatinine, Ser: 0.73 mg/dL (ref 0.44–1.00)
GFR calc Af Amer: >60 mL/min (ref >60)
GFR calc non Af Amer: >60 mL/min (ref >60)
Glucose, Bld: 101 mg/dL — ABNORMAL HIGH (ref 70–99)
Potassium: 3.6 mmol/L (ref 3.5–5.1)
Sodium: 138 mmol/L (ref 135–145)
Total Bilirubin: 0.4 mg/dL (ref 0.3–1.2)
Total Protein: 7.3 g/dL (ref 6.5–8.1)

## 2019-08-05 LAB — URINALYSIS, COMPLETE (UACMP) WITH MICROSCOPIC
Bacteria, UA: NONE SEEN
Bilirubin Urine: NEGATIVE
Glucose, UA: NEGATIVE mg/dL
Ketones, ur: NEGATIVE mg/dL
Nitrite: NEGATIVE
Protein, ur: 30 mg/dL — AB
RBC / HPF: >50 RBC/hpf — ABNORMAL HIGH (ref 0–5)
Specific Gravity, Urine: 1.03 (ref 1.005–1.030)
pH: 5 (ref 5.0–8.0)

## 2019-08-05 LAB — POCT PREGNANCY, URINE: Preg Test, Ur: NEGATIVE

## 2019-08-05 LAB — LIPASE, BLOOD: Lipase: 24 U/L (ref 11–51)

## 2019-08-05 NOTE — ED Triage Notes (Signed)
Pt c/o LLQ abdominal pain x1 day. Pain is decreased with standing. Pt denies N/V/D.

## 2022-01-17 ENCOUNTER — Ambulatory Visit (LOCAL_COMMUNITY_HEALTH_CENTER): Payer: Self-pay

## 2022-01-17 VITALS — BP 108/67 | Ht 63.0 in | Wt 248.5 lb

## 2022-01-17 DIAGNOSIS — Z3201 Encounter for pregnancy test, result positive: Secondary | ICD-10-CM

## 2022-01-17 LAB — PREGNANCY, URINE: Preg Test, Ur: POSITIVE — AB

## 2022-01-17 MED ORDER — PRENATAL 27-0.8 MG PO TABS: 1.0000 | ORAL_TABLET | Freq: Every day | ORAL | 0 refills | Status: DC

## 2022-01-17 NOTE — Progress Notes (Signed)
UPT positive. Unsure where she plans prenatal care. Local prenatal provider list given. Pt explains she has uterine fibroids that were dx 09/2021 at [facility]. Consult E Sciora, CNM who recommends pt to establish prenatal care soon and probable that her prenatal provider may order an early ultrasound. RN discussed with pt  and she is in agreement. Questions answered and reports understanding. Josie Saunders, RN Consulted on the plan of care for this client.  I agree with the documented note and actions taken to provide care for this client.  Ola Spurr, CNM

## 2022-02-05 ENCOUNTER — Telehealth: Payer: Self-pay | Admitting: Obstetrics and Gynecology

## 2022-02-05 ENCOUNTER — Ambulatory Visit (INDEPENDENT_AMBULATORY_CARE_PROVIDER_SITE_OTHER): Payer: Medicaid Other

## 2022-02-05 VITALS — Wt 250.0 lb

## 2022-02-05 DIAGNOSIS — O0990 Supervision of high risk pregnancy, unspecified, unspecified trimester: Secondary | ICD-10-CM | POA: Insufficient documentation

## 2022-02-05 DIAGNOSIS — Z3A00 Weeks of gestation of pregnancy not specified: Secondary | ICD-10-CM

## 2022-02-05 DIAGNOSIS — O09899 Supervision of other high risk pregnancies, unspecified trimester: Secondary | ICD-10-CM

## 2022-02-05 DIAGNOSIS — D259 Leiomyoma of uterus, unspecified: Secondary | ICD-10-CM

## 2022-02-05 NOTE — Progress Notes (Signed)
New OB Intake  I connected with  Jill Kelly on 02/05/22 at 11:15 AM EDT by telephone Video Visit and verified that I am speaking with the correct person using two identifiers. Nurse is located at [employer] and pt is located at home.  I explained I am completing New OB Intake today. We discussed her EDD of 07/29/2022 that is based on LMP of 10/22/2021 which is approx. Pt is G2/P0010. I reviewed her allergies, medications, Medical/Surgical/OB history, and appropriate screenings. Based on history, this is a/an pregnancy complicated by fibroids dx'd in 09/2021  .   Patient Active Problem List   Diagnosis Date Noted   Supervision of high risk pregnancy, antepartum 02/05/2022    Concerns addressed today Pt concerned about fibroids; didn't think you could get preg with fibroids.  Also, has had some heart palpitations that are uncomfortable; adv when it happens again to go to ER or UC.  Delivery Plans:  Plans to deliver at [facility]  [place] Explained u/s to be done d/t fibroids.  Schedulers will call her to schedule it.  Labs Discussed  genetic screening with patient. Patient aware genetic testing to be drawn at new OB lab visit. Discussed possible labs to be drawn at new OB appointment.  COVID Vaccine Patient has not had COVID vaccine.   Social Determinants of Health Food Insecurity: denies food insecurity  Transportation: Patient denies transportation needs.  First visit review I reviewed new OB appt with pt. I explained she will have ob bloodwork and pap smear/pelvic exam if indicated. Explained pt will be seen by Dr. Jeannie Fend at first visit; encounter routed to appropriate provider.   Cleophas Dunker, [facility] 02/05/2022  11:35 AM  Clinical Staff Provider  Office Location  [facility] Dating    Language  English Anatomy US    Flu Vaccine  offer Genetic Screen  NIPS:   TDaP vaccine   offer Hgb A1C or  GTT Early : Third trimester :   Covid  declined   LAB RESULTS   Rhogam   Blood Type     Feeding Plan breast Antibody    Contraception none Rubella    Circumcision yes RPR     Pediatrician  undecided HBsAg     Support Person Mom and sister HIV    Prenatal Classes no Varicella     GBS  (For PCN allergy, check sensitivities)   BTL Consent  Hep C   VBAC Consent  Pap      Hgb Electro    fibroids  CF      SMA

## 2022-02-05 NOTE — Telephone Encounter (Signed)
Made in error

## 2022-02-06 ENCOUNTER — Other Ambulatory Visit: Payer: Self-pay

## 2022-02-06 DIAGNOSIS — O0990 Supervision of high risk pregnancy, unspecified, unspecified trimester: Secondary | ICD-10-CM

## 2022-02-07 LAB — CBC/D/PLT+RPR+RH+ABO+RUBIGG...
Antibody Screen: NEGATIVE
Basophils Absolute: 0 10*3/uL (ref 0.0–0.2)
Basos: 0 %
EOS (ABSOLUTE): 0.1 10*3/uL (ref 0.0–0.4)
Eos: 1 %
HCV Ab: NONREACTIVE
HIV Screen 4th Generation wRfx: NONREACTIVE
Hematocrit: 36.4 % (ref 34.0–46.6)
Hemoglobin: 12.6 g/dL (ref 11.1–15.9)
Hepatitis B Surface Ag: NEGATIVE
Immature Grans (Abs): 0 10*3/uL (ref 0.0–0.1)
Immature Granulocytes: 0 %
Lymphocytes Absolute: 1.8 10*3/uL (ref 0.7–3.1)
Lymphs: 24 %
MCH: 32.4 pg (ref 26.6–33.0)
MCHC: 34.6 g/dL (ref 31.5–35.7)
MCV: 94 fL (ref 79–97)
Monocytes Absolute: 0.4 10*3/uL (ref 0.1–0.9)
Monocytes: 5 %
Neutrophils Absolute: 5.2 10*3/uL (ref 1.4–7.0)
Neutrophils: 70 %
Platelets: 301 10*3/uL (ref 150–450)
RBC: 3.89 x10E6/uL (ref 3.77–5.28)
RDW: 12.1 % (ref 11.7–15.4)
RPR Ser Ql: NONREACTIVE
Rh Factor: POSITIVE
Rubella Antibodies, IGG: 3.88 index (ref >0.99)
Varicella zoster IgG: 904 index (ref >165)
WBC: 7.4 10*3/uL (ref 3.4–10.8)

## 2022-02-07 LAB — HCV INTERPRETATION

## 2022-02-08 ENCOUNTER — Ambulatory Visit (INDEPENDENT_AMBULATORY_CARE_PROVIDER_SITE_OTHER): Payer: Medicaid Other

## 2022-02-08 DIAGNOSIS — D259 Leiomyoma of uterus, unspecified: Secondary | ICD-10-CM

## 2022-02-08 DIAGNOSIS — O3412 Maternal care for benign tumor of corpus uteri, second trimester: Secondary | ICD-10-CM

## 2022-02-08 DIAGNOSIS — O0990 Supervision of high risk pregnancy, unspecified, unspecified trimester: Secondary | ICD-10-CM

## 2022-02-12 LAB — MATERNIT 21 PLUS CORE, BLOOD
Fetal Fraction: 4
Result (T21): NEGATIVE
Trisomy 13 (Patau syndrome): NEGATIVE
Trisomy 18 (Edwards syndrome): NEGATIVE
Trisomy 21 (Down syndrome): NEGATIVE

## 2022-02-12 LAB — SPECIMEN STATUS REPORT

## 2022-02-26 ENCOUNTER — Encounter: Payer: Self-pay | Admitting: Obstetrics and Gynecology

## 2022-03-13 ENCOUNTER — Encounter: Payer: Self-pay | Admitting: Obstetrics and Gynecology

## 2022-03-13 ENCOUNTER — Other Ambulatory Visit (HOSPITAL_COMMUNITY)
Admission: RE | Admit: 2022-03-13 | Discharge: 2022-03-13 | Disposition: A | Discharge status: Home / Self Care | Payer: Medicaid Other | Source: Ambulatory Visit | Attending: Obstetrics and Gynecology | Admitting: Obstetrics and Gynecology

## 2022-03-13 ENCOUNTER — Ambulatory Visit (INDEPENDENT_AMBULATORY_CARE_PROVIDER_SITE_OTHER): Payer: Medicaid Other | Admitting: Obstetrics and Gynecology

## 2022-03-13 VITALS — BP 108/72 | HR 82 | Wt 252.6 lb

## 2022-03-13 DIAGNOSIS — O99212 Obesity complicating pregnancy, second trimester: Secondary | ICD-10-CM

## 2022-03-13 DIAGNOSIS — Z3A20 20 weeks gestation of pregnancy: Secondary | ICD-10-CM | POA: Diagnosis not present

## 2022-03-13 DIAGNOSIS — Z124 Encounter for screening for malignant neoplasm of cervix: Secondary | ICD-10-CM | POA: Diagnosis not present

## 2022-03-13 DIAGNOSIS — O0930 Supervision of pregnancy with insufficient antenatal care, unspecified trimester: Secondary | ICD-10-CM | POA: Diagnosis not present

## 2022-03-13 DIAGNOSIS — O09892 Supervision of other high risk pregnancies, second trimester: Secondary | ICD-10-CM

## 2022-03-13 DIAGNOSIS — O99342 Other mental disorders complicating pregnancy, second trimester: Secondary | ICD-10-CM

## 2022-03-13 DIAGNOSIS — O3412 Maternal care for benign tumor of corpus uteri, second trimester: Secondary | ICD-10-CM

## 2022-03-13 DIAGNOSIS — F419 Anxiety disorder, unspecified: Secondary | ICD-10-CM

## 2022-03-13 DIAGNOSIS — Z1379 Encounter for other screening for genetic and chromosomal anomalies: Secondary | ICD-10-CM

## 2022-03-13 DIAGNOSIS — D259 Leiomyoma of uterus, unspecified: Secondary | ICD-10-CM

## 2022-03-13 LAB — POCT URINALYSIS DIPSTICK OB
Bilirubin, UA: NEGATIVE
Blood, UA: NEGATIVE
Glucose, UA: NEGATIVE
Ketones, UA: NEGATIVE
Leukocytes, UA: NEGATIVE
Nitrite, UA: NEGATIVE
POC,PROTEIN,UA: NEGATIVE
Spec Grav, UA: 1.01 (ref 1.010–1.025)
Urobilinogen, UA: 0.2 E.U./dL
pH, UA: 7 (ref 5.0–8.0)

## 2022-03-13 MED ORDER — HYDROXYZINE HCL 25 MG PO TABS: 25.0000 mg | ORAL_TABLET | Freq: Four times a day (QID) | ORAL | 0 refills | Status: DC | PRN

## 2022-03-13 MED ORDER — COMPLETENATE 29-1 MG PO CHEW: 1.0000 | CHEWABLE_TABLET | Freq: Every day | ORAL | 11 refills | Status: DC

## 2022-03-13 NOTE — Progress Notes (Signed)
Patient presents today for New OB physical. She states not taking her prenatal vitamins due to constipation, will send alternate prenatal to pharmacy. Patient is due for her pap smear. Patient states no other questions or concerns at this time.

## 2022-03-13 NOTE — Progress Notes (Signed)
HPI:      Ms. Jill Kelly is a 31 y.o. G2P0010 who LMP was Patient's last menstrual period was 10/22/2021 (approximate).  Subjective:   She presents today to establish care for her prenatal visits and get caught up.  She was a late presentation to prenatal care.  She is not currently taking prenatal vitamins or aspirin.  Recent ultrasound reveals she is approximately 20 weeks estimated gestational age.  She did have MaterniT 21 which showed negative.  Her ultrasound shows a fibroid uterus. Patient states she has not felt the baby move. She does plan to breast-feed She complains of difficulty sleeping because her mind is racing and she has significant anxiety.  She sometimes has anxiety during the day as well.  She is concerned about being pregnant, having a baby, and being able to care for the baby after birth.     Hx: The following portions of the patient's history were reviewed and updated as appropriate:             She  has a past medical history of Fibroids. She does not have any pertinent problems on file. She  has a past surgical history that includes No past surgeries. Her family history includes Hypertension in her father and mother. She  reports that she has been smoking cigarettes. She has been smoking an average of .25 packs per day. She has never used smokeless tobacco. She reports that she does not currently use alcohol. She reports that she does not use drugs. She has a current medication list which includes the following prescription(s): hydroxyzine and prenatal vitamin w/fe, fa. She has No Known Allergies.       Review of Systems:  Review of Systems  Constitutional: Denied constitutional symptoms, night sweats, recent illness, fatigue, fever, insomnia and weight loss.  Eyes: Denied eye symptoms, eye pain, photophobia, vision change and visual disturbance.  Ears/Nose/Throat/Neck: Denied ear, nose, throat or neck symptoms, hearing loss, nasal discharge, sinus  congestion and sore throat.  Cardiovascular: Denied cardiovascular symptoms, arrhythmia, chest pain/pressure, edema, exercise intolerance, orthopnea and palpitations.  Respiratory: Denied pulmonary symptoms, asthma, pleuritic pain, productive sputum, cough, dyspnea and wheezing.  Gastrointestinal: Denied, gastro-esophageal reflux, melena, nausea and vomiting.  Genitourinary: Denied genitourinary symptoms including symptomatic vaginal discharge, pelvic relaxation issues, and urinary complaints.  Musculoskeletal: Denied musculoskeletal symptoms, stiffness, swelling, muscle weakness and myalgia.  Dermatologic: Denied dermatology symptoms, rash and scar.  Neurologic: Denied neurology symptoms, dizziness, headache, neck pain and syncope.  Psychiatric: Denied psychiatric symptoms, anxiety and depression.  Endocrine: Denied endocrine symptoms including hot flashes and night sweats.   Meds:   No current outpatient medications on file prior to visit.   No current facility-administered medications on file prior to visit.      Objective:     Vitals:   03/13/22 0908  BP: 108/72  Pulse: 82   Filed Weights   03/13/22 0908  Weight: 252 lb 9.6 oz (114.6 kg)              Physical examination General NAD, Conversant  HEENT Atraumatic; Op clear with mmm.  Normo-cephalic. Pupils reactive. Anicteric sclerae  Thyroid/Neck Smooth without nodularity or enlargement. Normal ROM.  Neck Supple.  Skin No rashes, lesions or ulceration. Normal palpated skin turgor. No nodularity.  Breasts: No masses or discharge.  Symmetric.  No axillary adenopathy.  Lungs: Clear to auscultation.No rales or wheezes. Normal Respiratory effort, no retractions.  Heart: NSR.  No murmurs or rubs appreciated. No periferal edema  Abdomen: Soft.  Non-tender.  Uterus 20 weeks -154 fetal heart tones  Extremities: Moves all appropriately.  Normal ROM for age. No lymphadenopathy.  Neuro: Oriented to PPT.  Normal mood. Normal affect.      Pelvic:   Vulva: Normal appearance.  No lesions.  Vagina: No lesions or abnormalities noted.  Support: Normal pelvic support.  Urethra No masses tenderness or scarring.  Meatus Normal size without lesions or prolapse.  Cervix: Normal appearance.  No lesions.  Anus: Normal exam.  No lesions.  Perineum: Normal exam.  No lesions.        Bimanual   Uterus: Normal size.  Non-tender.  Mobile.  AV.  Adnexae: No masses.  Non-tender to palpation.  Cul-de-sac: Negative for abnormality.             Assessment:    G2P0010 Patient Active Problem List   Diagnosis Date Noted   Supervision of high risk pregnancy, antepartum 02/05/2022     1. Supervision of other high risk pregnancies, second trimester   2. [redacted] weeks gestation of pregnancy   3. Cervical cancer screening   4. Genetic screening   5. Morbid obesity with BMI of 40.0-44.9, adult ([place])   6. Late prenatal care   7. Uterine fibroids affecting pregnancy in second trimester   8. Anxiety disorder affecting pregnancy, antepartum        Plan:            1.  Patient to begin prenatal vitamins  2.  Begin baby aspirin daily  3.  Anatomic fetal survey ultrasound scheduled  4.  Anesthesia consult ordered for elevated BMI  5.  aFP today  6.  Hemoglobin A1c today  7.  Discussed anxiety-Atarax prescription given we will follow-up at next visit.   Orders Orders Placed This Encounter  Procedures   US OB Comp + 14 Wk   AFP, Serum, Open Spina Bifida   Hemoglobin A1c   POC Urinalysis Dipstick OB     Meds ordered this encounter  Medications   prenatal vitamin w/FE, FA (NATACHEW) 29-1 MG CHEW chewable tablet    Sig: Chew 1 tablet by mouth daily at 12 noon.    Dispense:  30 tablet    Refill:  11   hydrOXYzine (ATARAX) 25 MG tablet    Sig: Take 1 tablet (25 mg total) by mouth every 6 (six) hours as needed for anxiety.    Dispense:  30 tablet    Refill:  0      F/U  Return in about 4 weeks (around 04/10/2022) for [facility]. I spent 47 minutes involved in the care of this patient preparing to see the patient by obtaining and reviewing her medical history (including labs, imaging tests and prior procedures), documenting clinical information in the electronic health record (EHR), counseling and coordinating care plans, writing and sending prescriptions, ordering tests or procedures and in direct communicating with the patient and medical staff discussing pertinent items from her history and physical exam.  Finis Bud, M.D. 03/13/2022 10:13 AM

## 2022-03-16 LAB — AFP, SERUM, OPEN SPINA BIFIDA
AFP MoM: 0.43
AFP Value: 21.1 ng/mL
Gest. Age on Collection Date: 20.3 weeks
Maternal Age At EDD: 33.2 yr
OSBR Risk 1 IN: 10000
Test Results:: NEGATIVE
Weight: 252 [lb_av]

## 2022-03-16 LAB — HEMOGLOBIN A1C
Est. average glucose Bld gHb Est-mCnc: 100 mg/dL
Hgb A1c MFr Bld: 5.1 % (ref 4.8–5.6)

## 2022-03-18 ENCOUNTER — Ambulatory Visit (INDEPENDENT_AMBULATORY_CARE_PROVIDER_SITE_OTHER): Payer: Medicaid Other

## 2022-03-18 DIAGNOSIS — Z3A15 15 weeks gestation of pregnancy: Secondary | ICD-10-CM | POA: Diagnosis not present

## 2022-03-18 DIAGNOSIS — O0932 Supervision of pregnancy with insufficient antenatal care, second trimester: Secondary | ICD-10-CM | POA: Diagnosis not present

## 2022-03-18 DIAGNOSIS — Z3A20 20 weeks gestation of pregnancy: Secondary | ICD-10-CM

## 2022-03-19 LAB — CYTOLOGY - PAP
Comment: NEGATIVE
Diagnosis: NEGATIVE
High risk HPV: NEGATIVE

## 2022-04-16 ENCOUNTER — Encounter: Payer: Self-pay | Admitting: Obstetrics and Gynecology

## 2022-04-16 ENCOUNTER — Ambulatory Visit (INDEPENDENT_AMBULATORY_CARE_PROVIDER_SITE_OTHER): Payer: Medicaid Other | Admitting: Obstetrics and Gynecology

## 2022-04-16 VITALS — BP 114/61 | HR 93 | Wt 255.9 lb

## 2022-04-16 DIAGNOSIS — O0990 Supervision of high risk pregnancy, unspecified, unspecified trimester: Secondary | ICD-10-CM

## 2022-04-16 DIAGNOSIS — Z720 Tobacco use: Secondary | ICD-10-CM | POA: Insufficient documentation

## 2022-04-16 DIAGNOSIS — Z3A19 19 weeks gestation of pregnancy: Secondary | ICD-10-CM

## 2022-04-16 DIAGNOSIS — O09892 Supervision of other high risk pregnancies, second trimester: Secondary | ICD-10-CM

## 2022-04-16 DIAGNOSIS — O0932 Supervision of pregnancy with insufficient antenatal care, second trimester: Secondary | ICD-10-CM | POA: Insufficient documentation

## 2022-04-16 DIAGNOSIS — O99210 Obesity complicating pregnancy, unspecified trimester: Secondary | ICD-10-CM | POA: Insufficient documentation

## 2022-04-16 DIAGNOSIS — O3412 Maternal care for benign tumor of corpus uteri, second trimester: Secondary | ICD-10-CM

## 2022-04-16 DIAGNOSIS — O99342 Other mental disorders complicating pregnancy, second trimester: Secondary | ICD-10-CM

## 2022-04-16 DIAGNOSIS — O99340 Other mental disorders complicating pregnancy, unspecified trimester: Secondary | ICD-10-CM

## 2022-04-16 DIAGNOSIS — D259 Leiomyoma of uterus, unspecified: Secondary | ICD-10-CM | POA: Insufficient documentation

## 2022-04-16 DIAGNOSIS — F419 Anxiety disorder, unspecified: Secondary | ICD-10-CM | POA: Insufficient documentation

## 2022-04-16 LAB — POCT URINALYSIS DIPSTICK OB
Bilirubin, UA: NEGATIVE
Blood, UA: NEGATIVE
Glucose, UA: NEGATIVE
Ketones, UA: NEGATIVE
Leukocytes, UA: NEGATIVE
Nitrite, UA: NEGATIVE
Spec Grav, UA: 1.025 (ref 1.010–1.025)
Urobilinogen, UA: 0.2 E.U./dL
pH, UA: 6 (ref 5.0–8.0)

## 2022-04-16 MED ORDER — HYDROXYZINE HCL 25 MG PO TABS: 25.0000 mg | ORAL_TABLET | Freq: Three times a day (TID) | ORAL | 2 refills | Status: DC | PRN

## 2022-04-16 NOTE — Progress Notes (Signed)
ROB: Patient notes she is unable to sleep, was having palpitations last visit. Is cutting down on cigarette use as well (previously 6 per day, now down to 2 per day). Was prescribed Atarax, but notes difficulty getting prescription filled. Will send new prescription to pharmacy. Also noting tingling in hands and feet. Discussed use of braces for carpal tunnel. Reports difficulty getting comfortable at night, gets SOB when trying to lie down. Discussed propping with pillows, advised not to lie flat.  Reviewed uterine fibroids in pregnancy and associated risks, given handout.  Discussed need for ferial fetal growth surveillance in third trimester. For anatomy scan in 1-2 weeks, order placed.  Will need referral to Anesthesia at 30 weeks as BMI now 45. Normal genetic screening and prenatal labs. RTC in 4 weeks for next OB visit. The patient has Medicaid.  [facility] Medicaid Risk Screening Form completed today.    Rubie Maid, MD [facility]

## 2022-04-16 NOTE — Progress Notes (Signed)
Jill Kelly. Patient states she is unable to sleep, she feels like both her arms and hands feel numb when she goes to sleep and feels like she suffocates when she sleeps on back. Patient states no other questions or concerns at this time.

## 2022-04-30 ENCOUNTER — Ambulatory Visit (INDEPENDENT_AMBULATORY_CARE_PROVIDER_SITE_OTHER): Payer: Medicaid Other

## 2022-04-30 DIAGNOSIS — D259 Leiomyoma of uterus, unspecified: Secondary | ICD-10-CM | POA: Diagnosis not present

## 2022-04-30 DIAGNOSIS — O3412 Maternal care for benign tumor of corpus uteri, second trimester: Secondary | ICD-10-CM | POA: Diagnosis not present

## 2022-04-30 DIAGNOSIS — O09892 Supervision of other high risk pregnancies, second trimester: Secondary | ICD-10-CM

## 2022-04-30 DIAGNOSIS — O99210 Obesity complicating pregnancy, unspecified trimester: Secondary | ICD-10-CM | POA: Diagnosis not present

## 2022-05-14 ENCOUNTER — Ambulatory Visit (INDEPENDENT_AMBULATORY_CARE_PROVIDER_SITE_OTHER): Payer: Medicaid Other | Admitting: Obstetrics and Gynecology

## 2022-05-14 ENCOUNTER — Other Ambulatory Visit: Payer: Medicaid Other

## 2022-05-14 ENCOUNTER — Encounter: Payer: Self-pay | Admitting: Obstetrics and Gynecology

## 2022-05-14 VITALS — BP 115/74 | HR 94 | Wt 260.9 lb

## 2022-05-14 DIAGNOSIS — O3412 Maternal care for benign tumor of corpus uteri, second trimester: Secondary | ICD-10-CM

## 2022-05-14 DIAGNOSIS — O09892 Supervision of other high risk pregnancies, second trimester: Secondary | ICD-10-CM

## 2022-05-14 DIAGNOSIS — D259 Leiomyoma of uterus, unspecified: Secondary | ICD-10-CM

## 2022-05-14 DIAGNOSIS — Z3A23 23 weeks gestation of pregnancy: Secondary | ICD-10-CM

## 2022-05-14 DIAGNOSIS — O99210 Obesity complicating pregnancy, unspecified trimester: Secondary | ICD-10-CM

## 2022-05-14 LAB — POCT URINALYSIS DIPSTICK OB
Bilirubin, UA: NEGATIVE
Blood, UA: NEGATIVE
Glucose, UA: NEGATIVE
Ketones, UA: NEGATIVE
Leukocytes, UA: NEGATIVE
Nitrite, UA: NEGATIVE
POC,PROTEIN,UA: NEGATIVE
Spec Grav, UA: 1.025 (ref 1.010–1.025)
Urobilinogen, UA: 0.2 E.U./dL
pH, UA: 7 (ref 5.0–8.0)

## 2022-05-14 NOTE — Progress Notes (Signed)
ROB: Doing well.  Feeling baby move.  Has anesthesia consult next week.  Discussed uterine fibroid and follow-up ultrasound with next visit especially for fetal growth.  1 hour GCT next visit.

## 2022-05-14 NOTE — Progress Notes (Signed)
ROB. Patient states daily fetal movement with pelvic pressure. She states numbness and tingling in her wrists, discussed wrist splints for relief. She states no questions or concerns at this time.

## 2022-05-23 ENCOUNTER — Inpatient Hospital Stay: Admission: RE | Admit: 2022-05-23 | Payer: Self-pay | Source: Ambulatory Visit

## 2022-06-11 ENCOUNTER — Other Ambulatory Visit (INDEPENDENT_AMBULATORY_CARE_PROVIDER_SITE_OTHER): Payer: Medicaid Other

## 2022-06-11 ENCOUNTER — Ambulatory Visit (INDEPENDENT_AMBULATORY_CARE_PROVIDER_SITE_OTHER): Payer: Medicaid Other | Admitting: Obstetrics and Gynecology

## 2022-06-11 ENCOUNTER — Ambulatory Visit: Payer: Medicaid Other

## 2022-06-11 ENCOUNTER — Encounter: Payer: Self-pay | Admitting: Obstetrics and Gynecology

## 2022-06-11 ENCOUNTER — Other Ambulatory Visit: Payer: Medicaid Other

## 2022-06-11 ENCOUNTER — Other Ambulatory Visit: Payer: Self-pay

## 2022-06-11 ENCOUNTER — Inpatient Hospital Stay: Admission: RE | Admit: 2022-06-11 | Payer: Self-pay | Source: Ambulatory Visit

## 2022-06-11 VITALS — BP 114/75 | HR 101 | Wt 260.1 lb

## 2022-06-11 VITALS — Wt 260.1 lb

## 2022-06-11 DIAGNOSIS — O0990 Supervision of high risk pregnancy, unspecified, unspecified trimester: Secondary | ICD-10-CM

## 2022-06-11 DIAGNOSIS — Z113 Encounter for screening for infections with a predominantly sexual mode of transmission: Secondary | ICD-10-CM

## 2022-06-11 DIAGNOSIS — R202 Paresthesia of skin: Secondary | ICD-10-CM

## 2022-06-11 DIAGNOSIS — R200 Anesthesia of skin: Secondary | ICD-10-CM

## 2022-06-11 DIAGNOSIS — Z3A27 27 weeks gestation of pregnancy: Secondary | ICD-10-CM

## 2022-06-11 DIAGNOSIS — Z23 Encounter for immunization: Secondary | ICD-10-CM

## 2022-06-11 DIAGNOSIS — Z131 Encounter for screening for diabetes mellitus: Secondary | ICD-10-CM

## 2022-06-11 DIAGNOSIS — O3412 Maternal care for benign tumor of corpus uteri, second trimester: Secondary | ICD-10-CM

## 2022-06-11 DIAGNOSIS — O99212 Obesity complicating pregnancy, second trimester: Secondary | ICD-10-CM

## 2022-06-11 DIAGNOSIS — D259 Leiomyoma of uterus, unspecified: Secondary | ICD-10-CM

## 2022-06-11 DIAGNOSIS — Z130 Encounter for screening for diseases of the blood and blood-forming organs and certain disorders involving the immune mechanism: Secondary | ICD-10-CM

## 2022-06-11 DIAGNOSIS — E669 Obesity, unspecified: Secondary | ICD-10-CM

## 2022-06-11 DIAGNOSIS — O99612 Diseases of the digestive system complicating pregnancy, second trimester: Secondary | ICD-10-CM

## 2022-06-11 DIAGNOSIS — O99210 Obesity complicating pregnancy, unspecified trimester: Secondary | ICD-10-CM

## 2022-06-11 DIAGNOSIS — K3184 Gastroparesis: Secondary | ICD-10-CM

## 2022-06-11 DIAGNOSIS — O09892 Supervision of other high risk pregnancies, second trimester: Secondary | ICD-10-CM

## 2022-06-11 LAB — POCT URINALYSIS DIPSTICK OB
Bilirubin, UA: NEGATIVE
Blood, UA: NEGATIVE
Glucose, UA: NEGATIVE
Ketones, UA: NEGATIVE
Nitrite, UA: NEGATIVE
POC,PROTEIN,UA: NEGATIVE
Spec Grav, UA: 1.015 (ref 1.010–1.025)
Urobilinogen, UA: 0.2 E.U./dL
pH, UA: 6.5 (ref 5.0–8.0)

## 2022-06-11 NOTE — Patient Instructions (Signed)

## 2022-06-11 NOTE — Progress Notes (Signed)
ROB: Patient complains of issues with sleeping. Was prescribed Atarax but notes that it doesn't help. Discussed use of Melatonin or Unisom.  Also complains of pain in left foot, feels like burning or tingling. Also notes having issues with digestion.  Notes that it feels like her food just sits there. Sometimes makes her nauseated and occasionally vomits. Denies heartburn. Discussed possible use of Reglan for gastroparesis, also encouraged changing from 3 large meals to 5-6 smaller meals/snacks per day. For 28 week labs today.  Plans to  plans to breastfeed, desires unsure method for contraception (thinking NFP or withdrawal). For Tdap today, signed blood consent. Declines flu vaccine. For growth scan to be reschedued (missed appointment this morning as she was unaware of time) to f/u fibroid in pregnancy. RTC in 2 weeks.

## 2022-06-11 NOTE — Progress Notes (Unsigned)
ROB [redacted]w[redacted]d She is doing well, she has some constant pain in her wrist. She has been wearing compression sleeves with no relief from pain, but it does help with numbness.

## 2022-06-12 ENCOUNTER — Encounter: Payer: Self-pay | Admitting: Obstetrics and Gynecology

## 2022-06-12 LAB — CBC
Hematocrit: 34.3 % (ref 34.0–46.6)
Hemoglobin: 11.7 g/dL (ref 11.1–15.9)
MCH: 31.6 pg (ref 26.6–33.0)
MCHC: 34.1 g/dL (ref 31.5–35.7)
MCV: 93 fL (ref 79–97)
Platelets: 270 10*3/uL (ref 150–450)
RBC: 3.7 x10E6/uL — ABNORMAL LOW (ref 3.77–5.28)
RDW: 13 % (ref 11.7–15.4)
WBC: 7.5 10*3/uL (ref 3.4–10.8)

## 2022-06-12 LAB — GLUCOSE, 1 HOUR GESTATIONAL: Gestational Diabetes Screen: 131 mg/dL (ref 70–139)

## 2022-06-12 LAB — RPR: RPR Ser Ql: NONREACTIVE

## 2022-06-20 ENCOUNTER — Other Ambulatory Visit: Payer: Medicaid Other

## 2022-06-21 ENCOUNTER — Telehealth: Payer: Self-pay | Admitting: Obstetrics and Gynecology

## 2022-06-21 NOTE — Telephone Encounter (Signed)
Reached out to pt to reschedule [place] that was originally scheduled for 11/2 at 2:00.  Left message for pt to call back to reschedule.

## 2022-06-24 ENCOUNTER — Encounter: Payer: Self-pay | Admitting: Obstetrics and Gynecology

## 2022-06-24 ENCOUNTER — Encounter: Payer: Medicaid Other | Admitting: Advanced Practice Midwife

## 2022-06-24 ENCOUNTER — Telehealth: Payer: Self-pay | Admitting: Advanced Practice Midwife

## 2022-06-24 NOTE — Telephone Encounter (Signed)
Reached out to pt (2x) to reschedule [place] that was originally scheduled for 11/2 at 2:00.  Could not leave a message, call could not be completed.  Will send a MyChart letter.

## 2022-06-24 NOTE — Telephone Encounter (Signed)
Reached out to pt to reschedule ROB appt that was scheduled on 06/24/22 at 10:15.  Could not leave a message, call could not be completed.

## 2022-06-24 NOTE — Telephone Encounter (Signed)
Patient  confirmed appointment scheduled for 11/21 with Missy

## 2022-06-25 ENCOUNTER — Telehealth: Payer: Self-pay | Admitting: Advanced Practice Midwife

## 2022-06-25 NOTE — Telephone Encounter (Signed)
Reached out to pt to reschedule 11/6 ROB with JEG at 10:15.  Did not reach on 11/6.  Contacted pt on 11/7 and rescheduled the [place] appt for 11/9 at [facility].

## 2022-06-25 NOTE — Telephone Encounter (Signed)
I contacted patient via phone to rescheduled ultrasound. I left voicemail for patient to call back to rescheduled. I can offer today as work in at 3:30 pm ok per [employer]

## 2022-06-25 NOTE — Telephone Encounter (Addendum)
Next week- 11/13 at 1:3011/14 10:45 or 3:30,  11/15- open slots at 1:00 and 3:00,11/16- open slot at 3:00 per KP also

## 2022-06-26 NOTE — Telephone Encounter (Signed)
Patient is scheduled for 07/09/22

## 2022-06-27 ENCOUNTER — Encounter: Payer: Medicaid Other | Admitting: Obstetrics and Gynecology

## 2022-06-27 ENCOUNTER — Telehealth: Payer: Self-pay | Admitting: Obstetrics and Gynecology

## 2022-06-27 DIAGNOSIS — O3413 Maternal care for benign tumor of corpus uteri, third trimester: Secondary | ICD-10-CM

## 2022-06-27 DIAGNOSIS — O0993 Supervision of high risk pregnancy, unspecified, third trimester: Secondary | ICD-10-CM

## 2022-06-27 DIAGNOSIS — O99210 Obesity complicating pregnancy, unspecified trimester: Secondary | ICD-10-CM

## 2022-06-27 DIAGNOSIS — Z3A30 30 weeks gestation of pregnancy: Secondary | ICD-10-CM

## 2022-06-27 NOTE — Telephone Encounter (Signed)
Reached out to pt to reschedule ROB appt that was scheduled for 06/27/22 with Dr. Amalia Hailey at 9:45. Mailbox was full, could not leave a message.

## 2022-06-28 NOTE — Telephone Encounter (Signed)
Contacted pt to reschedule ROB appt was scheduled for 06/27/2022 with Dr. Amalia Hailey at 9:45.  Was able to reschedule pt to 11/15 with Dr. Amalia Hailey at 8:45.

## 2022-07-01 ENCOUNTER — Ambulatory Visit (INDEPENDENT_AMBULATORY_CARE_PROVIDER_SITE_OTHER): Payer: Medicaid Other

## 2022-07-01 DIAGNOSIS — D259 Leiomyoma of uterus, unspecified: Secondary | ICD-10-CM | POA: Diagnosis not present

## 2022-07-01 DIAGNOSIS — Z3A30 30 weeks gestation of pregnancy: Secondary | ICD-10-CM

## 2022-07-01 DIAGNOSIS — O3413 Maternal care for benign tumor of corpus uteri, third trimester: Secondary | ICD-10-CM | POA: Diagnosis not present

## 2022-07-03 ENCOUNTER — Encounter: Payer: Self-pay | Admitting: Obstetrics and Gynecology

## 2022-07-03 ENCOUNTER — Ambulatory Visit (INDEPENDENT_AMBULATORY_CARE_PROVIDER_SITE_OTHER): Payer: Medicaid Other | Admitting: Obstetrics and Gynecology

## 2022-07-03 VITALS — BP 133/80 | HR 92 | Wt 263.8 lb

## 2022-07-03 DIAGNOSIS — O99210 Obesity complicating pregnancy, unspecified trimester: Secondary | ICD-10-CM

## 2022-07-03 DIAGNOSIS — D259 Leiomyoma of uterus, unspecified: Secondary | ICD-10-CM

## 2022-07-03 DIAGNOSIS — O0993 Supervision of high risk pregnancy, unspecified, third trimester: Secondary | ICD-10-CM

## 2022-07-03 DIAGNOSIS — Z3A30 30 weeks gestation of pregnancy: Secondary | ICD-10-CM

## 2022-07-03 DIAGNOSIS — O09293 Supervision of pregnancy with other poor reproductive or obstetric history, third trimester: Secondary | ICD-10-CM

## 2022-07-03 NOTE — Progress Notes (Signed)
Jill Kelly. Patient states daily fetal movement with no pain or pressure. She states ongoing pain related to carpal tunnel, been wearing wrist splints for 2 weeks. Patient also has complaints of insomnia.

## 2022-07-03 NOTE — Progress Notes (Signed)
ROB: She is "over this pregnancy".  Does report daily fetal movement.  Still having trouble with carpal tunnel syndrome and pain at night.  She has wrist splints but not rigid splints.  We have discussed the use of rigid splints during sleep.  She says Atarax is not helping her sleep.  Taking prenatal vitamins as directed.  Recent growth scan shows 56 percentile.  Desires delivery "as early as possible".  Has not kept her anesthesia appointment but has rescheduled it for 6 days from now.  Importance of anesthesia consult discussed.

## 2022-07-09 ENCOUNTER — Inpatient Hospital Stay: Admission: RE | Admit: 2022-07-09 | Payer: Self-pay | Source: Ambulatory Visit

## 2022-07-09 ENCOUNTER — Encounter: Payer: Medicaid Other | Admitting: Obstetrics

## 2022-07-17 ENCOUNTER — Ambulatory Visit (INDEPENDENT_AMBULATORY_CARE_PROVIDER_SITE_OTHER): Payer: Medicaid Other | Admitting: Advanced Practice Midwife

## 2022-07-17 ENCOUNTER — Encounter: Payer: Self-pay | Admitting: Advanced Practice Midwife

## 2022-07-17 VITALS — BP 100/70 | Wt 264.0 lb

## 2022-07-17 DIAGNOSIS — E669 Obesity, unspecified: Secondary | ICD-10-CM

## 2022-07-17 DIAGNOSIS — F17210 Nicotine dependence, cigarettes, uncomplicated: Secondary | ICD-10-CM

## 2022-07-17 DIAGNOSIS — O99210 Obesity complicating pregnancy, unspecified trimester: Secondary | ICD-10-CM

## 2022-07-17 DIAGNOSIS — O0933 Supervision of pregnancy with insufficient antenatal care, third trimester: Secondary | ICD-10-CM

## 2022-07-17 DIAGNOSIS — O99343 Other mental disorders complicating pregnancy, third trimester: Secondary | ICD-10-CM

## 2022-07-17 DIAGNOSIS — O99333 Smoking (tobacco) complicating pregnancy, third trimester: Secondary | ICD-10-CM

## 2022-07-17 DIAGNOSIS — O0993 Supervision of high risk pregnancy, unspecified, third trimester: Secondary | ICD-10-CM

## 2022-07-17 DIAGNOSIS — Z3A32 32 weeks gestation of pregnancy: Secondary | ICD-10-CM

## 2022-07-17 DIAGNOSIS — O3413 Maternal care for benign tumor of corpus uteri, third trimester: Secondary | ICD-10-CM

## 2022-07-17 DIAGNOSIS — D259 Leiomyoma of uterus, unspecified: Secondary | ICD-10-CM

## 2022-07-17 DIAGNOSIS — O99213 Obesity complicating pregnancy, third trimester: Secondary | ICD-10-CM

## 2022-07-17 DIAGNOSIS — F419 Anxiety disorder, unspecified: Secondary | ICD-10-CM

## 2022-07-17 NOTE — Progress Notes (Signed)
Routine Prenatal Care Visit  Subjective  Jill Kelly is a 31 y.o. G2P0010 at 7w6dbeing seen today for ongoing prenatal care.  She is currently monitored for the following issues for this high-risk pregnancy and has Supervision of high risk pregnancy, antepartum; Anxiety disorder affecting pregnancy, antepartum; Late prenatal care affecting pregnancy in second trimester; Obesity in pregnancy; Uterine fibroids affecting pregnancy in second trimester; and Tobacco abuse on their problem list.  ----------------------------------------------------------------------------------- Patient reports no complaints.  She will see ALorayne Bendertoday.  Contractions: Not present. Vag. Bleeding: None.  Movement: Present. Leaking Fluid denies.  ----------------------------------------------------------------------------------- The following portions of the patient's history were reviewed and updated as appropriate: allergies, current medications, past family history, past medical history, past social history, past surgical history and problem list. Problem list updated.  Objective  Blood pressure 100/70, weight 264 lb (119.7 kg), last menstrual period 10/22/2021. Pregravid weight 240 lb (108.9 kg) Total Weight Gain 24 lb (10.9 kg) Urinalysis: Urine Protein    Urine Glucose    Fetal Status: Fetal Heart Rate (bpm): 154   Movement: Present     General:  Alert, oriented and cooperative. Patient is in no acute distress.  Skin: Skin is warm and dry. No rash noted.   Cardiovascular: Normal heart rate noted  Respiratory: Normal respiratory effort, no problems with respiration noted  Abdomen: Soft, gravid, appropriate for gestational age. Pain/Pressure: Absent     Pelvic:  Cervical exam deferred        Extremities: Normal range of motion.  Edema: Trace  Mental Status: Normal mood and affect. Normal behavior. Normal judgment and thought content.   Assessment   31y.o. G2P0010 at 353w6dy  09/05/2022, by  Ultrasound presenting for routine prenatal visit  Plan     Preterm labor symptoms and general obstetric precautions including but not limited to vaginal bleeding, contractions, leaking of fluid and fetal movement were reviewed in detail with the patient. Please refer to After Visit Summary for other counseling recommendations.   Return in about 2 weeks (around 07/31/2022) for rob.  JaRod CanCNM 07/17/2022 4:13 PM

## 2022-08-01 ENCOUNTER — Ambulatory Visit (INDEPENDENT_AMBULATORY_CARE_PROVIDER_SITE_OTHER): Payer: Medicaid Other | Admitting: Licensed Practical Nurse

## 2022-08-01 ENCOUNTER — Encounter: Payer: Self-pay | Admitting: Licensed Practical Nurse

## 2022-08-01 VITALS — BP 108/73 | HR 103 | Wt 266.0 lb

## 2022-08-01 DIAGNOSIS — O0993 Supervision of high risk pregnancy, unspecified, third trimester: Secondary | ICD-10-CM

## 2022-08-01 DIAGNOSIS — Z3A35 35 weeks gestation of pregnancy: Secondary | ICD-10-CM

## 2022-08-01 NOTE — Progress Notes (Signed)
Routine Prenatal Care Visit  Subjective  Jill Kelly is a 31 y.o. G2P0010 at 24w0dbeing seen today for ongoing prenatal care.  She is currently monitored for the following issues for this high-risk pregnancy and has Supervision of high risk pregnancy, antepartum; Anxiety disorder affecting pregnancy, antepartum; Late prenatal care affecting pregnancy in second trimester; Obesity in pregnancy; Uterine fibroids affecting pregnancy in second trimester; and Tobacco abuse on their problem list.  ----------------------------------------------------------------------------------- Patient reports occasional lower abd pressure. Feels ready to meet this baby.  Watching some social media on labor and birth, plans to breastfeed-encouraged to read/watch videos. Has been "emotional" lately, denies concerns for depression.  -Lives with her parents, they will be able to give some PP support.  -reviewed GBS screening  -reminded pt pt that she needs to keep her Anesthesia consult -here with FOB  Contractions: Not present. Vag. Bleeding: None.  Movement: Present. Leaking Fluid denies.  ----------------------------------------------------------------------------------- The following portions of the patient's history were reviewed and updated as appropriate: allergies, current medications, past family history, past medical history, past social history, past surgical history and problem list. Problem list updated.  Objective  Blood pressure 108/73, pulse (!) 103, weight 266 lb (120.7 kg), last menstrual period 10/22/2021. Pregravid weight 240 lb (108.9 kg) Total Weight Gain 26 lb (11.8 kg) Urinalysis: Urine Protein    Urine Glucose    Fetal Status: Fetal Heart Rate (bpm): 140 Fundal Height: 40 cm Movement: Present     General:  Alert, oriented and cooperative. Patient is in no acute distress.  Skin: Skin is warm and dry. No rash noted.   Cardiovascular: Normal heart rate noted  Respiratory: Normal  respiratory effort, no problems with respiration noted  Abdomen: Soft, gravid, appropriate for gestational age. Pain/Pressure: Absent     Pelvic:  Cervical exam deferred        Extremities: Normal range of motion.  Edema: Trace  Mental Status: Normal mood and affect. Normal behavior. Normal judgment and thought content.   Assessment   31y.o. G2P0010 at 365w0dy  09/05/2022, by Ultrasound presenting for routine prenatal visit  Plan   second Problems (from 02/05/22 to present)     No problems associated with this episode.        Preterm labor symptoms and general obstetric precautions including but not limited to vaginal bleeding, contractions, leaking of fluid and fetal movement were reviewed in detail with the patient. Please refer to After Visit Summary for other counseling recommendations.   Return in about 1 week (around 08/08/2022) for ROB, NST , 36wks labs .  Growth scan at 36wks ordered   LyRoberto ScalesCNGoodviewroup  08/01/22  4:55 PM

## 2022-08-07 DIAGNOSIS — Z3A36 36 weeks gestation of pregnancy: Secondary | ICD-10-CM | POA: Insufficient documentation

## 2022-08-07 DIAGNOSIS — O0993 Supervision of high risk pregnancy, unspecified, third trimester: Secondary | ICD-10-CM | POA: Insufficient documentation

## 2022-08-07 NOTE — Patient Instructions (Incomplete)

## 2022-08-07 NOTE — Progress Notes (Deleted)
    NURSE VISIT NOTE  Subjective:    Patient ID: Jill Kelly, female    DOB: 06/30/1991, 31 y.o.   MRN: [id number]  HPI  Patient is a 31 y.o. G63P0010 female who presents for fetal monitoring per order from Roberto Scales, [place].   Objective:    LMP 10/22/2021 (Approximate)  Estimated Date of Delivery: 09/05/22  Assessment:   1. Supervision of high risk pregnancy in third trimester   2. Obesity in pregnancy   3. [redacted] weeks gestation of pregnancy      Plan:   Results reviewed and discussed with patient by  Rubie Maid, MD.     Otila Kluver, LPN

## 2022-08-08 ENCOUNTER — Other Ambulatory Visit: Payer: Medicaid Other

## 2022-08-08 ENCOUNTER — Encounter: Payer: Medicaid Other | Admitting: Obstetrics and Gynecology

## 2022-08-08 ENCOUNTER — Other Ambulatory Visit: Payer: Self-pay

## 2022-08-08 DIAGNOSIS — Z3A36 36 weeks gestation of pregnancy: Secondary | ICD-10-CM

## 2022-08-08 DIAGNOSIS — O0993 Supervision of high risk pregnancy, unspecified, third trimester: Secondary | ICD-10-CM

## 2022-08-08 DIAGNOSIS — O99210 Obesity complicating pregnancy, unspecified trimester: Secondary | ICD-10-CM

## 2022-08-08 DIAGNOSIS — Z113 Encounter for screening for infections with a predominantly sexual mode of transmission: Secondary | ICD-10-CM

## 2022-08-08 DIAGNOSIS — Z3685 Encounter for antenatal screening for Streptococcus B: Secondary | ICD-10-CM

## 2022-08-08 DIAGNOSIS — D259 Leiomyoma of uterus, unspecified: Secondary | ICD-10-CM

## 2022-08-08 NOTE — Progress Notes (Signed)
Patient having growth scan. BPP added for appropriate weekly antenatal testing beginning at 36 wks.

## 2022-08-09 ENCOUNTER — Telehealth: Payer: Self-pay | Admitting: Obstetrics and Gynecology

## 2022-08-09 NOTE — Telephone Encounter (Signed)
Reached out to patient to reschedule her appointments that she had for 08-08-22 for US/Growth/BPP at 3:30pm. As well as her 4:15 pm 1 week ROB with [facility]. Asked the patient to call back to the office.

## 2022-08-09 NOTE — Telephone Encounter (Signed)
Reached back out to patient and gave her centralized scheduling to get rs for dating/bpp and told the patient we will call her once we can find a spot for a 1 week ROB.

## 2022-08-14 ENCOUNTER — Telehealth: Payer: Self-pay

## 2022-08-14 NOTE — Telephone Encounter (Signed)
Parker from [facility] u/s calling to see if pt need BPP and growth or just BPP.  [phone]  Pt's appt is tomorrow.

## 2022-08-14 NOTE — Telephone Encounter (Signed)
She needs both.

## 2022-08-14 NOTE — Telephone Encounter (Signed)
Parker aware both is needed; will change growth order to >14wks as they haven't done her before.

## 2022-08-15 ENCOUNTER — Ambulatory Visit
Admission: RE | Admit: 2022-08-15 | Discharge: 2022-08-15 | Disposition: A | Discharge status: Home / Self Care | Payer: Medicaid Other | Source: Ambulatory Visit | Attending: Obstetrics and Gynecology | Admitting: Obstetrics and Gynecology

## 2022-08-15 ENCOUNTER — Ambulatory Visit: Admission: RE | Admit: 2022-08-15 | Payer: Medicaid Other | Source: Ambulatory Visit

## 2022-08-15 DIAGNOSIS — O3413 Maternal care for benign tumor of corpus uteri, third trimester: Secondary | ICD-10-CM | POA: Insufficient documentation

## 2022-08-15 DIAGNOSIS — Z3A36 36 weeks gestation of pregnancy: Secondary | ICD-10-CM | POA: Insufficient documentation

## 2022-08-15 DIAGNOSIS — O0993 Supervision of high risk pregnancy, unspecified, third trimester: Secondary | ICD-10-CM | POA: Insufficient documentation

## 2022-08-15 DIAGNOSIS — O99210 Obesity complicating pregnancy, unspecified trimester: Secondary | ICD-10-CM | POA: Insufficient documentation

## 2022-08-15 DIAGNOSIS — Z3A35 35 weeks gestation of pregnancy: Secondary | ICD-10-CM | POA: Insufficient documentation

## 2022-08-15 DIAGNOSIS — D259 Leiomyoma of uterus, unspecified: Secondary | ICD-10-CM | POA: Insufficient documentation

## 2022-08-19 NOTE — L&D Delivery Note (Signed)
Delivery Summary for Jill Kelly  Labor Events:   Preterm labor: No data found  Rupture date: 09/04/2022  Rupture time: 8:00 AM  Rupture type: Artificial Intact  Fluid Color: Clear  Induction: No data found  Augmentation: No data found  Complications: No data found  Cervical ripening: No data found No data found   No data found     Delivery:   Episiotomy: No data found  Lacerations: No data found  Repair suture: No data found  Repair # of packets: No data found  Blood loss (ml): 1015   Information for the patient's newborn:  Terressa, Evola [[id number]]   Delivery 09/04/2022 6:50 PM by  C-Section, Low Transverse Sex:  female Gestational Age: 66w6dDelivery Clinician:   Living?:         APGARS  One minute Five minutes Ten minutes  Skin color:        Heart rate:        Grimace:        Muscle tone:        Breathing:        Totals: 7  9      Presentation/position:      Resuscitation:   Cord information:    Disposition of cord blood:     Blood gases sent?  Complications:   Placenta: Delivered:       appearance Newborn Measurements: Weight: 7 lb 15.7 oz (3620 g)  Height: 20.47"  Head circumference:    Chest circumference:    Other providers:    Additional  information: Forceps:   Vacuum:   Breech:   Observed anomalies       See Dr. CAndreas Bloweroperative note for details of C-section procedure.    CRubie Maid MD [facility]

## 2022-08-21 ENCOUNTER — Other Ambulatory Visit: Payer: Medicaid Other

## 2022-08-21 DIAGNOSIS — Z3A37 37 weeks gestation of pregnancy: Secondary | ICD-10-CM | POA: Insufficient documentation

## 2022-08-21 NOTE — Patient Instructions (Signed)

## 2022-08-21 NOTE — Progress Notes (Signed)
    NURSE VISIT NOTE  Subjective:    Patient ID: Jill Kelly, female    DOB: 05-27-1991, 32 y.o.   MRN: [id number]  HPI  Patient is a 32 y.o. G58P0010 female who presents for fetal monitoring per order from Roberto Scales, [place].   Objective:    BP 117/74   Pulse 99   Ht '5\' 2"'$  (1.575 m)   Wt 272 lb (123.4 kg)   LMP 10/22/2021 (Approximate)   BMI 49.75 kg/m  Estimated Date of Delivery: 09/05/22  Assessment:   1. Supervision of high risk pregnancy in third trimester   2. Obesity in pregnancy   3. Uterine fibroids affecting pregnancy in third trimester   4. [redacted] weeks gestation of pregnancy      Plan:   Results reviewed and discussed with patient by  Jeannie Fend, MD.     Otila Kluver, LPN N

## 2022-08-21 NOTE — Telephone Encounter (Signed)
Patient is scheduled for 08/22/22 with Dr. Amalia Hailey

## 2022-08-22 ENCOUNTER — Ambulatory Visit: Payer: Medicaid Other

## 2022-08-22 ENCOUNTER — Other Ambulatory Visit (HOSPITAL_COMMUNITY)
Admission: RE | Admit: 2022-08-22 | Discharge: 2022-08-22 | Disposition: A | Discharge status: Home / Self Care | Payer: Medicaid Other | Source: Ambulatory Visit | Attending: Obstetrics and Gynecology | Admitting: Obstetrics and Gynecology

## 2022-08-22 ENCOUNTER — Ambulatory Visit (INDEPENDENT_AMBULATORY_CARE_PROVIDER_SITE_OTHER): Payer: Medicaid Other | Admitting: Obstetrics and Gynecology

## 2022-08-22 VITALS — BP 117/74 | HR 99 | Ht 62.0 in | Wt 272.0 lb

## 2022-08-22 VITALS — BP 117/74 | HR 99 | Wt 272.0 lb

## 2022-08-22 DIAGNOSIS — O288 Other abnormal findings on antenatal screening of mother: Secondary | ICD-10-CM

## 2022-08-22 DIAGNOSIS — Z113 Encounter for screening for infections with a predominantly sexual mode of transmission: Secondary | ICD-10-CM | POA: Insufficient documentation

## 2022-08-22 DIAGNOSIS — O0993 Supervision of high risk pregnancy, unspecified, third trimester: Secondary | ICD-10-CM

## 2022-08-22 DIAGNOSIS — Z3A38 38 weeks gestation of pregnancy: Secondary | ICD-10-CM | POA: Diagnosis not present

## 2022-08-22 DIAGNOSIS — Z3685 Encounter for antenatal screening for Streptococcus B: Secondary | ICD-10-CM

## 2022-08-22 DIAGNOSIS — O99210 Obesity complicating pregnancy, unspecified trimester: Secondary | ICD-10-CM

## 2022-08-22 DIAGNOSIS — Z3A37 37 weeks gestation of pregnancy: Secondary | ICD-10-CM

## 2022-08-22 DIAGNOSIS — D259 Leiomyoma of uterus, unspecified: Secondary | ICD-10-CM

## 2022-08-22 LAB — POCT URINALYSIS DIPSTICK
Bilirubin, UA: NEGATIVE
Blood, UA: NEGATIVE
Glucose, UA: NEGATIVE
Ketones, UA: NEGATIVE
Nitrite, UA: NEGATIVE
Protein, UA: POSITIVE — AB
Spec Grav, UA: 1.015 (ref 1.010–1.025)
Urobilinogen, UA: 0.2 E.U./dL
pH, UA: 6.5 (ref 5.0–8.0)

## 2022-08-22 NOTE — Progress Notes (Signed)
ROB [redacted]w[redacted]d NST, GBS/Aptima today. Patient states she is having increased pelvic pain/pressure with good fetal movement.

## 2022-08-22 NOTE — Progress Notes (Addendum)
ROB: Patient has been noncompliant with visits.  She has been noncompliant with anesthesia consult.  I have stressed the importance of both of these going forward.  A new anesthesia consult was made today.  Patient scheduled for induction at midnight on 1/17.  BPP scheduled for 1 week from today.  P.o. hydration encouraged.  NST reactive today.  No contractions.  Signs and symptoms of labor discussed.  GC/CT-GBS performed today.

## 2022-08-23 ENCOUNTER — Encounter: Payer: Self-pay | Admitting: Obstetrics and Gynecology

## 2022-08-23 LAB — CERVICOVAGINAL ANCILLARY ONLY
Chlamydia: NEGATIVE
Comment: NEGATIVE
Comment: NORMAL
Neisseria Gonorrhea: NEGATIVE

## 2022-08-24 LAB — STREP GP B NAA: Strep Gp B NAA: NEGATIVE

## 2022-08-26 ENCOUNTER — Other Ambulatory Visit: Payer: Medicaid Other

## 2022-08-27 ENCOUNTER — Ambulatory Visit: Admission: RE | Admit: 2022-08-27 | Payer: Medicaid Other | Source: Ambulatory Visit

## 2022-08-27 ENCOUNTER — Other Ambulatory Visit: Payer: Medicaid Other

## 2022-09-02 ENCOUNTER — Observation Stay
Admission: EM | Admit: 2022-09-02 | Discharge: 2022-09-02 | Disposition: A | Discharge status: Home / Self Care | Payer: Medicaid Other | Attending: Licensed Practical Nurse | Admitting: Licensed Practical Nurse

## 2022-09-02 ENCOUNTER — Encounter: Payer: Self-pay | Admitting: Obstetrics and Gynecology

## 2022-09-02 ENCOUNTER — Other Ambulatory Visit: Payer: Self-pay

## 2022-09-02 ENCOUNTER — Inpatient Hospital Stay: Payer: Medicaid Other

## 2022-09-02 DIAGNOSIS — O368130 Decreased fetal movements, third trimester, not applicable or unspecified: Secondary | ICD-10-CM | POA: Diagnosis present

## 2022-09-02 DIAGNOSIS — F17210 Nicotine dependence, cigarettes, uncomplicated: Secondary | ICD-10-CM | POA: Diagnosis not present

## 2022-09-02 DIAGNOSIS — Z79899 Other long term (current) drug therapy: Secondary | ICD-10-CM | POA: Diagnosis not present

## 2022-09-02 DIAGNOSIS — Z3A39 39 weeks gestation of pregnancy: Secondary | ICD-10-CM | POA: Insufficient documentation

## 2022-09-02 DIAGNOSIS — O368190 Decreased fetal movements, unspecified trimester, not applicable or unspecified: Secondary | ICD-10-CM | POA: Diagnosis present

## 2022-09-02 HISTORY — DX: Other specified health status: Z78.9

## 2022-09-02 MED ORDER — LACTATED RINGERS IV BOLUS
1000.0000 mL | Freq: Once | INTRAVENOUS | Status: AC
  Administered 2022-09-02: 1000 mL via INTRAVENOUS

## 2022-09-02 NOTE — Consult Note (Signed)
Anesthesiology consult note ( Ambulatory referral to OB anesthesia for morbid obesity) :  I had the distinct pleasure of meeting Jill Kelly  today for an OB anesthesia precheck.  She has a history of morbid obesity with a BMI today of 49.75.  She is [redacted]w[redacted]d  Patient reports no previous problems with anesthesia, no problems with her back and has a MP score of III.  We discussed the risks of Obesity in Pregnancy including but not limited to: Patients with a BMI > 40 are at increased risk for complications during pregnancy, such as hypertensive disorders of pregnancy, gestational diabetes, obstructive sleep apnea, thromboembolic disease, prolonged labor, operative vaginal delivery and need for cesarean delivery. There is an increased risk of airway complications, including inability to place a breathing tube, should an emergency cesarean delivery be required. There is an increased risk of aspiration, where stomach contents get into the lungs and can cause inflammation, infection and even respiratory failure. Epidural placement is more difficult in obesity, often requires multiple attempts, more often results in inadequate pain relief, more often requires replacement due to movement of the epidural catheter and more often results in accidental dural puncture and post dural puncture headache.  Patient voiced understanding acknowledging that we had discussed her pathway forward.

## 2022-09-02 NOTE — OB Triage Note (Signed)
Patient discharged home via private vehicle driven by FOB. Scheduled induction for Wednesday at midnight. Patient disgruntled regarding being discharged home and prefers to begin the induction tonight but staffing prevents this plan of treatment.

## 2022-09-02 NOTE — Discharge Summary (Signed)
Physician Final Progress Note  Patient ID: Jill Kelly MRN: [id number] DOB/AGE: 1991-05-28 32 y.o.  Admit date: 09/02/2022 Admitting provider: Harlin Heys, MD Discharge date: 09/02/2022   Admission Diagnoses:  1) intrauterine pregnancy at [redacted]w[redacted]d 2) decreased fetal movement   Discharge Diagnoses:  Active Problems:   * No active hospital problems. *  RNST, BPP 8/8 Sciatic pain   History of Present Illness: The patient is a 32y.o. female G2P0010 at 363w4dho presents for decreased fetal movement. She has noticed movement since last night.  She was up multiple times during the night for a burning pain in her lower left back/buttock area.  During those times that she was awake, she did not notice movement, she did not noticed movement when she work for the day, nor after eating two meals.  She did have tea on the way in.  On arrival to the unit she reported feeling movement. She has felt occasional contractions. She is here with her partner and wonders if she can stay as she is scheduled for IOL secondary to High BMI on 1/17. She was scheduled for a BPP and anesthesia consult, she missed both appointments.   Past Medical History:  Diagnosis Date   Fibroids    uterine fibroids- dx 09/2021   Medical history non-contributory     Past Surgical History:  Procedure Laterality Date   NO PAST SURGERIES      No current facility-administered medications on file prior to encounter.   Current Outpatient Medications on File Prior to Encounter  Medication Sig Dispense Refill   prenatal vitamin w/FE, FA (NATACHEW) 29-1 MG CHEW chewable tablet Chew 1 tablet by mouth daily at 12 noon. 30 tablet 11    No Known Allergies  Social History   Socioeconomic History   Marital status: Single    Spouse name: Not on file   Number of children: 0   Years of education: 12   Highest education level: Not on file  Occupational History   Occupation: braid hair  Tobacco Use   Smoking  status: Some Days    Packs/day: 0.25    Types: Cigarettes   Smokeless tobacco: Never  Vaping Use   Vaping Use: Never used  Substance and Sexual Activity   Alcohol use: Not Currently    Comment: last use 12/2021   Drug use: No   Sexual activity: Yes    Birth control/protection: None  Other Topics Concern   Not on file  Social History Narrative   Not on file   Social Determinants of Health   Financial Resource Strain: Medium Risk (02/05/2022)   Overall Financial Resource Strain (CARDIA)    Difficulty of Paying Living Expenses: Somewhat hard  Food Insecurity: No Food Insecurity (02/05/2022)   Hunger Vital Sign    Worried About Running Out of Food in the Last Year: Never true    Ran Out of Food in the Last Year: Never true  Transportation Needs: No Transportation Needs (02/05/2022)   PRAPARE - TrHydrologistMedical): No    Lack of Transportation (Non-Medical): No  Physical Activity: Inactive (02/05/2022)   Exercise Vital Sign    Days of Exercise per Week: 0 days    Minutes of Exercise per Session: 0 min  Stress: Stress Concern Present (02/05/2022)   FiElkins  Feeling of Stress : To some extent  Social Connections: Unknown (02/05/2022)   Social Connection  and Isolation Panel [NHANES]    Frequency of Communication with Friends and Family: More than three times a week    Frequency of Social Gatherings with Friends and Family: More than three times a week    Attends Religious Services: Never    Marine scientist or Organizations: No    Attends Archivist Meetings: Never    Marital Status: Not on file  Intimate Partner Violence: Not At Risk (02/05/2022)   Humiliation, Afraid, Rape, and Kick questionnaire    Fear of Current or Ex-Partner: No    Emotionally Abused: No    Physically Abused: No    Sexually Abused: No    Family History  Problem Relation Age of Onset    Hypertension Mother    Hypertension Father      ROS see HPI  Physical Exam: BP (!) 110/54   Pulse 80   Temp 97.7 F (36.5 C) (Axillary)   Resp 18   Ht '5\' 2"'$  (1.575 m)   Wt 123.4 kg   LMP 10/22/2021 (Approximate)   BMI 49.75 kg/m   Physical Exam Constitutional:      Appearance: Normal appearance.  Genitourinary:     Genitourinary Comments: Exam deferred   Pulmonary:     Effort: Pulmonary effort is normal.  Abdominal:     Comments: gravid  Musculoskeletal:     Cervical back: Normal range of motion.     Right lower leg: Edema present.     Left lower leg: Edema present.  Neurological:     General: No focal deficit present.     Mental Status: She is alert.  Psychiatric:        Mood and Affect: Mood normal.   EFM baseline 135, moderate variability, pos accel, neg decel  Toco: irregular   Consults: Anesthesia for high BMI see note   Significant Findings/ Diagnostic Studies: BPP 8/8   Procedures: RNST   Hospital Course: The patient was admitted to Labor and Delivery Triage for observation. She had a RNST and a BPP 8/8.  Dr Amie Critchley completed her anesthesia consult. Reviewed above findings with Dr Amalia Hailey, per Dr Amalia Hailey discharge pt, keep scheduled IOL. Pt upset at being sent home.  Attempted to discuss comfort measures with pt, pt covered her ears and would not make eye contact. Offered to answer any questions, per her partner they do not have any questions.   Discharge Condition: good  Disposition: Discharge disposition: 01-Home or Self Care       Diet: Regular diet  Discharge Activity: Activity as tolerated   Allergies as of 09/02/2022   No Known Allergies      Medication List     TAKE these medications    prenatal vitamin w/FE, FA 29-1 MG Chew chewable tablet Chew 1 tablet by mouth daily at 12 noon.         Total time spent taking care of this patient: 15 minutes  Signed: [place], CNM  09/02/2022, 7:45 PM

## 2022-09-02 NOTE — Progress Notes (Signed)
Pt presents to L/D triage with reported decreased fetal movement since last night. She describes that she has felt movement since PO hydrating, but it is still decreased. She reports no bleeding or LOF and positive fetal movement.  Pt reports mild ctx noted and constant back pain, rated 7/10, that is decreased by position changes and rest.  Monitors applied and assessing. Initial FHT 150. VSS. +2 pedal edema noted, no PIH symptoms.  Pt missed BPP this week- BPP in house ordered.

## 2022-09-03 ENCOUNTER — Other Ambulatory Visit: Payer: Self-pay | Admitting: Obstetrics

## 2022-09-04 ENCOUNTER — Other Ambulatory Visit: Payer: Self-pay

## 2022-09-04 ENCOUNTER — Encounter: Payer: Self-pay | Admitting: Obstetrics

## 2022-09-04 ENCOUNTER — Inpatient Hospital Stay: Payer: Medicaid Other | Admitting: Anesthesiology

## 2022-09-04 ENCOUNTER — Inpatient Hospital Stay
Admission: EM | Admit: 2022-09-04 | Discharge: 2022-09-06 | DRG: 788 | Disposition: A | Discharge status: Home / Self Care | Payer: Medicaid Other | Attending: Advanced Practice Midwife | Admitting: Advanced Practice Midwife

## 2022-09-04 ENCOUNTER — Encounter
Admission: EM | Disposition: A | Discharge status: Home / Self Care | Payer: Self-pay | Source: Home / Self Care | Attending: Advanced Practice Midwife

## 2022-09-04 DIAGNOSIS — O9902 Anemia complicating childbirth: Secondary | ICD-10-CM | POA: Diagnosis present

## 2022-09-04 DIAGNOSIS — Z3A39 39 weeks gestation of pregnancy: Secondary | ICD-10-CM

## 2022-09-04 DIAGNOSIS — D259 Leiomyoma of uterus, unspecified: Secondary | ICD-10-CM

## 2022-09-04 DIAGNOSIS — O3413 Maternal care for benign tumor of corpus uteri, third trimester: Secondary | ICD-10-CM | POA: Diagnosis present

## 2022-09-04 DIAGNOSIS — E669 Obesity, unspecified: Secondary | ICD-10-CM

## 2022-09-04 DIAGNOSIS — O368130 Decreased fetal movements, third trimester, not applicable or unspecified: Secondary | ICD-10-CM | POA: Diagnosis present

## 2022-09-04 DIAGNOSIS — D62 Acute posthemorrhagic anemia: Secondary | ICD-10-CM

## 2022-09-04 DIAGNOSIS — F17210 Nicotine dependence, cigarettes, uncomplicated: Secondary | ICD-10-CM

## 2022-09-04 DIAGNOSIS — O9903 Anemia complicating the puerperium: Secondary | ICD-10-CM

## 2022-09-04 DIAGNOSIS — O99334 Smoking (tobacco) complicating childbirth: Secondary | ICD-10-CM | POA: Diagnosis present

## 2022-09-04 DIAGNOSIS — O339 Maternal care for disproportion, unspecified: Secondary | ICD-10-CM | POA: Diagnosis present

## 2022-09-04 DIAGNOSIS — O99214 Obesity complicating childbirth: Secondary | ICD-10-CM | POA: Diagnosis present

## 2022-09-04 LAB — TYPE AND SCREEN
ABO/RH(D): A POS
Antibody Screen: NEGATIVE

## 2022-09-04 LAB — CBC
HCT: 33.9 % — ABNORMAL LOW (ref 36.0–46.0)
Hemoglobin: 11.6 g/dL — ABNORMAL LOW (ref 12.0–15.0)
MCH: 30.2 pg (ref 26.0–34.0)
MCHC: 34.2 g/dL (ref 30.0–36.0)
MCV: 88.3 fL (ref 80.0–100.0)
Platelets: 272 10*3/uL (ref 150–400)
RBC: 3.84 MIL/uL — ABNORMAL LOW (ref 3.87–5.11)
RDW: 13.2 % (ref 11.5–15.5)
WBC: 7.5 10*3/uL (ref 4.0–10.5)
nRBC: 0 % (ref 0.0–0.2)

## 2022-09-04 LAB — ABO/RH: ABO/RH(D): A POS

## 2022-09-04 LAB — RPR: RPR Ser Ql: NONREACTIVE

## 2022-09-04 SURGERY — Surgical Case
Anesthesia: Spinal

## 2022-09-04 MED ORDER — NALOXONE HCL 4 MG/10ML IJ SOLN: 1.0000 ug/kg/h | INTRAVENOUS | Status: DC | PRN

## 2022-09-04 MED ORDER — FENTANYL CITRATE (PF) 100 MCG/2ML IJ SOLN
INTRAMUSCULAR | Status: AC
  Filled 2022-09-04: qty 2

## 2022-09-04 MED ORDER — NALOXONE HCL 0.4 MG/ML IJ SOLN: 0.4000 mg | INTRAMUSCULAR | Status: DC | PRN

## 2022-09-04 MED ORDER — SODIUM CHLORIDE 0.9 % IV SOLN
INTRAVENOUS | Status: DC | PRN
  Administered 2022-09-04: 7 mL via EPIDURAL

## 2022-09-04 MED ORDER — KETOROLAC TROMETHAMINE 30 MG/ML IJ SOLN
30.0000 mg | Freq: Four times a day (QID) | INTRAMUSCULAR | Status: AC
  Administered 2022-09-05 (×3): 30 mg via INTRAVENOUS
  Filled 2022-09-04 (×3): qty 1

## 2022-09-04 MED ORDER — SODIUM CHLORIDE (PF) 0.9 % IJ SOLN
INTRAMUSCULAR | Status: AC
  Filled 2022-09-04: qty 50

## 2022-09-04 MED ORDER — IBUPROFEN 600 MG PO TABS
600.0000 mg | ORAL_TABLET | Freq: Four times a day (QID) | ORAL | Status: DC
  Administered 2022-09-05 – 2022-09-06 (×3): 600 mg via ORAL
  Filled 2022-09-04 (×3): qty 1

## 2022-09-04 MED ORDER — DEXAMETHASONE SODIUM PHOSPHATE 10 MG/ML IJ SOLN
INTRAMUSCULAR | Status: DC | PRN
  Administered 2022-09-04: 8 mg via INTRAVENOUS

## 2022-09-04 MED ORDER — GABAPENTIN 300 MG PO CAPS
300.0000 mg | ORAL_CAPSULE | Freq: Two times a day (BID) | ORAL | Status: DC
  Administered 2022-09-04 – 2022-09-06 (×4): 300 mg via ORAL
  Filled 2022-09-04 (×4): qty 1

## 2022-09-04 MED ORDER — HYDROMORPHONE HCL 1 MG/ML IJ SOLN: 1.0000 mg | INTRAMUSCULAR | Status: DC | PRN

## 2022-09-04 MED ORDER — LACTATED RINGERS IV SOLN: INTRAVENOUS | Status: DC

## 2022-09-04 MED ORDER — LACTATED RINGERS IV SOLN
500.0000 mL | INTRAVENOUS | Status: DC | PRN
  Administered 2022-09-04: 500 mL via INTRAVENOUS

## 2022-09-04 MED ORDER — FENTANYL-BUPIVACAINE-NACL 0.5-0.125-0.9 MG/250ML-% EP SOLN
12.0000 mL/h | EPIDURAL | Status: DC | PRN
  Administered 2022-09-04: 12 mL/h via EPIDURAL

## 2022-09-04 MED ORDER — OXYTOCIN-SODIUM CHLORIDE 30-0.9 UT/500ML-% IV SOLN
2.5000 [IU]/h | INTRAVENOUS | Status: AC
  Administered 2022-09-04: 2.5 [IU]/h via INTRAVENOUS

## 2022-09-04 MED ORDER — CEFAZOLIN SODIUM-DEXTROSE 2-4 GM/100ML-% IV SOLN
INTRAVENOUS | Status: AC
  Filled 2022-09-04: qty 100

## 2022-09-04 MED ORDER — LIDOCAINE 5 % EX PTCH: 1.0000 | MEDICATED_PATCH | CUTANEOUS | Status: DC

## 2022-09-04 MED ORDER — LACTATED RINGERS IV SOLN
500.0000 mL | Freq: Once | INTRAVENOUS | Status: AC
  Administered 2022-09-04: 500 mL via INTRAVENOUS

## 2022-09-04 MED ORDER — LIDOCAINE HCL (PF) 1 % IJ SOLN: 30.0000 mL | INTRAMUSCULAR | Status: DC | PRN

## 2022-09-04 MED ORDER — CEFAZOLIN IN SODIUM CHLORIDE 3-0.9 GM/100ML-% IV SOLN
3.0000 g | INTRAVENOUS | Status: AC
  Administered 2022-09-04: 3 g via INTRAVENOUS
  Filled 2022-09-04: qty 100

## 2022-09-04 MED ORDER — SODIUM CHLORIDE 0.9 % IV SOLN
INTRAVENOUS | Status: AC
  Filled 2022-09-04: qty 5

## 2022-09-04 MED ORDER — PHENYLEPHRINE HCL-NACL 20-0.9 MG/250ML-% IV SOLN
INTRAVENOUS | Status: DC | PRN
  Administered 2022-09-04: 50 ug/min via INTRAVENOUS

## 2022-09-04 MED ORDER — BUPIVACAINE HCL (PF) 0.5 % IJ SOLN
INTRAMUSCULAR | Status: AC
  Filled 2022-09-04: qty 30

## 2022-09-04 MED ORDER — PRENATAL MULTIVITAMIN CH
1.0000 | ORAL_TABLET | Freq: Every day | ORAL | Status: DC
  Administered 2022-09-05 – 2022-09-06 (×2): 1 via ORAL
  Filled 2022-09-04 (×2): qty 1

## 2022-09-04 MED ORDER — OXYTOCIN 10 UNIT/ML IJ SOLN
INTRAMUSCULAR | Status: AC
  Filled 2022-09-04: qty 2

## 2022-09-04 MED ORDER — MORPHINE SULFATE (PF) 0.5 MG/ML IJ SOLN
INTRAMUSCULAR | Status: AC
  Filled 2022-09-04: qty 10

## 2022-09-04 MED ORDER — METHYLERGONOVINE MALEATE 0.2 MG/ML IJ SOLN
INTRAMUSCULAR | Status: AC
  Filled 2022-09-04: qty 1

## 2022-09-04 MED ORDER — FENTANYL-BUPIVACAINE-NACL 0.5-0.125-0.9 MG/250ML-% EP SOLN
EPIDURAL | Status: AC
  Filled 2022-09-04: qty 250

## 2022-09-04 MED ORDER — ONDANSETRON HCL 4 MG/2ML IJ SOLN: 4.0000 mg | Freq: Three times a day (TID) | INTRAMUSCULAR | Status: DC | PRN

## 2022-09-04 MED ORDER — ZOLPIDEM TARTRATE 5 MG PO TABS: 5.0000 mg | ORAL_TABLET | Freq: Every evening | ORAL | Status: DC | PRN

## 2022-09-04 MED ORDER — LIDOCAINE HCL (PF) 1 % IJ SOLN
INTRAMUSCULAR | Status: DC | PRN
  Administered 2022-09-04 (×2): 3 mL via SUBCUTANEOUS
  Administered 2022-09-04: 2 mL via SUBCUTANEOUS
  Administered 2022-09-04: 3 mL via SUBCUTANEOUS

## 2022-09-04 MED ORDER — DIPHENHYDRAMINE HCL 25 MG PO CAPS: 25.0000 mg | ORAL_CAPSULE | ORAL | Status: DC | PRN

## 2022-09-04 MED ORDER — KETOROLAC TROMETHAMINE 30 MG/ML IJ SOLN
30.0000 mg | Freq: Four times a day (QID) | INTRAMUSCULAR | Status: DC
  Administered 2022-09-04: 30 mg via INTRAVENOUS
  Filled 2022-09-04: qty 1

## 2022-09-04 MED ORDER — PHENYLEPHRINE HCL-NACL 20-0.9 MG/250ML-% IV SOLN
INTRAVENOUS | Status: AC
  Filled 2022-09-04: qty 250

## 2022-09-04 MED ORDER — DIPHENHYDRAMINE HCL 50 MG/ML IJ SOLN: 12.5000 mg | INTRAMUSCULAR | Status: DC | PRN

## 2022-09-04 MED ORDER — TERBUTALINE SULFATE 1 MG/ML IJ SOLN
INTRAMUSCULAR | Status: AC
  Administered 2022-09-04: 1 mg
  Filled 2022-09-04: qty 1

## 2022-09-04 MED ORDER — ACETAMINOPHEN 500 MG PO TABS
1000.0000 mg | ORAL_TABLET | Freq: Four times a day (QID) | ORAL | Status: DC
  Administered 2022-09-04: 1000 mg via ORAL
  Filled 2022-09-04: qty 2

## 2022-09-04 MED ORDER — MENTHOL 3 MG MT LOZG: 1.0000 | LOZENGE | OROMUCOSAL | Status: DC | PRN

## 2022-09-04 MED ORDER — COCONUT OIL OIL: 1.0000 | TOPICAL_OIL | Status: DC | PRN

## 2022-09-04 MED ORDER — DIBUCAINE (PERIANAL) 1 % EX OINT: 1.0000 | TOPICAL_OINTMENT | CUTANEOUS | Status: DC | PRN

## 2022-09-04 MED ORDER — ACETAMINOPHEN 500 MG PO TABS
1000.0000 mg | ORAL_TABLET | Freq: Four times a day (QID) | ORAL | Status: DC
  Administered 2022-09-05 – 2022-09-06 (×6): 1000 mg via ORAL
  Filled 2022-09-04 (×7): qty 2

## 2022-09-04 MED ORDER — FENTANYL CITRATE (PF) 100 MCG/2ML IJ SOLN
INTRAMUSCULAR | Status: DC | PRN
  Administered 2022-09-04: 15 ug via INTRATHECAL

## 2022-09-04 MED ORDER — HYDROXYZINE HCL 25 MG PO TABS: 50.0000 mg | ORAL_TABLET | Freq: Four times a day (QID) | ORAL | Status: DC | PRN

## 2022-09-04 MED ORDER — ACETAMINOPHEN 325 MG PO TABS
650.0000 mg | ORAL_TABLET | ORAL | Status: DC | PRN
  Administered 2022-09-04: 650 mg via ORAL
  Filled 2022-09-04: qty 2

## 2022-09-04 MED ORDER — BUPIVACAINE HCL (PF) 0.5 % IJ SOLN
INTRAMUSCULAR | Status: DC | PRN
  Administered 2022-09-04: 30 mL

## 2022-09-04 MED ORDER — SOD CITRATE-CITRIC ACID 500-334 MG/5ML PO SOLN: 30.0000 mL | ORAL | Status: DC | PRN

## 2022-09-04 MED ORDER — EPHEDRINE 5 MG/ML INJ
10.0000 mg | INTRAVENOUS | Status: DC | PRN
  Administered 2022-09-04: 10 mg via INTRAVENOUS

## 2022-09-04 MED ORDER — SODIUM CHLORIDE 0.9 % IV SOLN
500.0000 mg | INTRAVENOUS | Status: AC
  Administered 2022-09-04: 500 mg via INTRAVENOUS
  Filled 2022-09-04: qty 5

## 2022-09-04 MED ORDER — WITCH HAZEL-GLYCERIN EX PADS: 1.0000 | MEDICATED_PAD | CUTANEOUS | Status: DC | PRN

## 2022-09-04 MED ORDER — ONDANSETRON HCL 4 MG/2ML IJ SOLN
INTRAMUSCULAR | Status: DC | PRN
  Administered 2022-09-04: 4 mg via INTRAVENOUS

## 2022-09-04 MED ORDER — OXYTOCIN-SODIUM CHLORIDE 30-0.9 UT/500ML-% IV SOLN
2.5000 [IU]/h | INTRAVENOUS | Status: DC
  Administered 2022-09-04: 30 [IU]/h via INTRAVENOUS
  Filled 2022-09-04: qty 500

## 2022-09-04 MED ORDER — PHENYLEPHRINE 80 MCG/ML (10ML) SYRINGE FOR IV PUSH (FOR BLOOD PRESSURE SUPPORT): 80.0000 ug | PREFILLED_SYRINGE | INTRAVENOUS | Status: DC | PRN

## 2022-09-04 MED ORDER — LIDOCAINE HCL (PF) 1 % IJ SOLN
INTRAMUSCULAR | Status: AC
  Filled 2022-09-04: qty 30

## 2022-09-04 MED ORDER — SIMETHICONE 80 MG PO CHEW
80.0000 mg | CHEWABLE_TABLET | ORAL | Status: DC | PRN
  Administered 2022-09-06: 80 mg via ORAL
  Filled 2022-09-04: qty 1

## 2022-09-04 MED ORDER — KETOROLAC TROMETHAMINE 30 MG/ML IJ SOLN: 30.0000 mg | Freq: Four times a day (QID) | INTRAMUSCULAR | Status: DC | PRN

## 2022-09-04 MED ORDER — LIDOCAINE-EPINEPHRINE (PF) 1.5 %-1:200000 IJ SOLN
INTRAMUSCULAR | Status: DC | PRN
  Administered 2022-09-04: 3 mL via EPIDURAL

## 2022-09-04 MED ORDER — OXYTOCIN-SODIUM CHLORIDE 30-0.9 UT/500ML-% IV SOLN
INTRAVENOUS | Status: AC
  Filled 2022-09-04: qty 500

## 2022-09-04 MED ORDER — SCOPOLAMINE 1 MG/3DAYS TD PT72: 1.0000 | MEDICATED_PATCH | Freq: Once | TRANSDERMAL | Status: DC

## 2022-09-04 MED ORDER — SODIUM CHLORIDE 0.9% FLUSH: 3.0000 mL | INTRAVENOUS | Status: DC | PRN

## 2022-09-04 MED ORDER — SOD CITRATE-CITRIC ACID 500-334 MG/5ML PO SOLN
30.0000 mL | ORAL | Status: AC
  Administered 2022-09-04: 30 mL via ORAL

## 2022-09-04 MED ORDER — ACETAMINOPHEN 500 MG PO TABS: 1000.0000 mg | ORAL_TABLET | Freq: Four times a day (QID) | ORAL | Status: DC

## 2022-09-04 MED ORDER — CARBOPROST TROMETHAMINE 250 MCG/ML IM SOLN
INTRAMUSCULAR | Status: AC
  Filled 2022-09-04: qty 1

## 2022-09-04 MED ORDER — AMMONIA AROMATIC IN INHA
RESPIRATORY_TRACT | Status: AC
  Filled 2022-09-04: qty 10

## 2022-09-04 MED ORDER — OXYTOCIN BOLUS FROM INFUSION: 333.0000 mL | Freq: Once | INTRAVENOUS | Status: DC

## 2022-09-04 MED ORDER — LIDOCAINE 5 % EX PTCH
MEDICATED_PATCH | CUTANEOUS | Status: AC
  Filled 2022-09-04: qty 1

## 2022-09-04 MED ORDER — TERBUTALINE SULFATE 1 MG/ML IJ SOLN
0.2500 mg | Freq: Once | INTRAMUSCULAR | Status: AC | PRN
  Administered 2022-09-04: 0.25 mg via SUBCUTANEOUS

## 2022-09-04 MED ORDER — MAGNESIUM HYDROXIDE 400 MG/5ML PO SUSP: 30.0000 mL | ORAL | Status: DC | PRN

## 2022-09-04 MED ORDER — MISOPROSTOL 200 MCG PO TABS
ORAL_TABLET | ORAL | Status: AC
  Filled 2022-09-04: qty 4

## 2022-09-04 MED ORDER — LIDOCAINE 5 % EX PTCH
1.0000 | MEDICATED_PATCH | CUTANEOUS | Status: DC
  Administered 2022-09-05: 1 via TRANSDERMAL
  Filled 2022-09-04: qty 1

## 2022-09-04 MED ORDER — MISOPROSTOL 50MCG HALF TABLET
50.0000 ug | ORAL_TABLET | ORAL | Status: DC | PRN
  Administered 2022-09-04: 50 ug via VAGINAL
  Filled 2022-09-04: qty 1

## 2022-09-04 MED ORDER — FENTANYL CITRATE (PF) 100 MCG/2ML IJ SOLN
50.0000 ug | INTRAMUSCULAR | Status: DC | PRN
  Administered 2022-09-04: 100 ug via INTRAVENOUS
  Filled 2022-09-04: qty 2

## 2022-09-04 MED ORDER — SENNOSIDES-DOCUSATE SODIUM 8.6-50 MG PO TABS
2.0000 | ORAL_TABLET | Freq: Every day | ORAL | Status: DC
  Administered 2022-09-05 – 2022-09-06 (×2): 2 via ORAL
  Filled 2022-09-04 (×2): qty 2

## 2022-09-04 MED ORDER — EPHEDRINE 5 MG/ML INJ
10.0000 mg | INTRAVENOUS | Status: DC | PRN
  Filled 2022-09-04: qty 5

## 2022-09-04 MED ORDER — TRANEXAMIC ACID-NACL 1000-0.7 MG/100ML-% IV SOLN
INTRAVENOUS | Status: AC
  Filled 2022-09-04: qty 100

## 2022-09-04 MED ORDER — BUPIVACAINE HCL (PF) 0.5 % IJ SOLN: 30.0000 mL | Freq: Once | INTRAMUSCULAR | Status: DC

## 2022-09-04 MED ORDER — DIPHENHYDRAMINE HCL 25 MG PO CAPS: 25.0000 mg | ORAL_CAPSULE | Freq: Four times a day (QID) | ORAL | Status: DC | PRN

## 2022-09-04 MED ORDER — BUPIVACAINE IN DEXTROSE 0.75-8.25 % IT SOLN
INTRATHECAL | Status: DC | PRN
  Administered 2022-09-04: 1.2 mL via INTRATHECAL

## 2022-09-04 MED ORDER — SOD CITRATE-CITRIC ACID 500-334 MG/5ML PO SOLN
ORAL | Status: AC
  Filled 2022-09-04: qty 15

## 2022-09-04 MED ORDER — FERROUS SULFATE 325 (65 FE) MG PO TABS
325.0000 mg | ORAL_TABLET | ORAL | Status: DC
  Administered 2022-09-05: 325 mg via ORAL
  Filled 2022-09-04: qty 1

## 2022-09-04 MED ORDER — IBUPROFEN 600 MG PO TABS: 600.0000 mg | ORAL_TABLET | Freq: Four times a day (QID) | ORAL | Status: DC

## 2022-09-04 MED ORDER — TRAMADOL HCL 50 MG PO TABS: 50.0000 mg | ORAL_TABLET | Freq: Four times a day (QID) | ORAL | Status: DC | PRN

## 2022-09-04 MED ORDER — ONDANSETRON HCL 4 MG/2ML IJ SOLN: 4.0000 mg | Freq: Four times a day (QID) | INTRAMUSCULAR | Status: DC | PRN

## 2022-09-04 MED ORDER — OXYCODONE HCL 5 MG PO TABS
5.0000 mg | ORAL_TABLET | ORAL | Status: DC | PRN
  Administered 2022-09-05: 5 mg via ORAL
  Administered 2022-09-06: 10 mg via ORAL
  Filled 2022-09-04 (×2): qty 2
  Filled 2022-09-04: qty 1

## 2022-09-04 MED ORDER — LACTATED RINGERS AMNIOINFUSION
INTRAVENOUS | Status: DC
  Administered 2022-09-04: 300 mL via INTRAUTERINE
  Filled 2022-09-04 (×3): qty 1000

## 2022-09-04 MED ORDER — MORPHINE SULFATE (PF) 0.5 MG/ML IJ SOLN
INTRAMUSCULAR | Status: DC | PRN
  Administered 2022-09-04: .1 mg via INTRATHECAL

## 2022-09-04 SURGICAL SUPPLY — 37 items
APL PRP STRL LF DISP 70% ISPRP (MISCELLANEOUS) ×2
APL SKNCLS STERI-STRIP NONHPOA (GAUZE/BANDAGES/DRESSINGS) ×1
BAG COUNTER SPONGE SURGICOUNT (BAG) ×1 IMPLANT
BAG SPNG CNTER NS LX DISP (BAG) ×1
BENZOIN TINCTURE PRP APPL 2/3 (GAUZE/BANDAGES/DRESSINGS) IMPLANT
BNDG TENSOPLAST 6X5 (GAUZE/BANDAGES/DRESSINGS) IMPLANT
CHLORAPREP W/TINT 26 (MISCELLANEOUS) ×2 IMPLANT
CLOSURE STERI STRIP 1/2 X4 (GAUZE/BANDAGES/DRESSINGS) IMPLANT
DRESSING TELFA 4X3 1S ST N-ADH (GAUZE/BANDAGES/DRESSINGS) IMPLANT
DRSG TELFA 3X8 NADH STRL (GAUZE/BANDAGES/DRESSINGS) ×1 IMPLANT
ELECT REM PT RETURN 9FT ADLT (ELECTROSURGICAL) ×1
ELECTRODE REM PT RTRN 9FT ADLT (ELECTROSURGICAL) ×1 IMPLANT
EXTRT SYSTEM ALEXIS 17CM (MISCELLANEOUS)
GAUZE SPONGE 4X4 12PLY STRL (GAUZE/BANDAGES/DRESSINGS) ×1 IMPLANT
GLOVE BIO SURGEON STRL SZ 6.5 (GLOVE) ×1 IMPLANT
GLOVE SURG UNDER LTX SZ7 (GLOVE) ×1 IMPLANT
GOWN STRL REUS W/ TWL LRG LVL3 (GOWN DISPOSABLE) ×2 IMPLANT
GOWN STRL REUS W/TWL LRG LVL3 (GOWN DISPOSABLE) ×2
KIT TURNOVER KIT A (KITS) ×1 IMPLANT
MANIFOLD NEPTUNE II (INSTRUMENTS) ×1 IMPLANT
MAT PREVALON FULL STRYKER (MISCELLANEOUS) ×1 IMPLANT
NS IRRIG 1000ML POUR BTL (IV SOLUTION) ×1 IMPLANT
PACK C SECTION AR (MISCELLANEOUS) ×1 IMPLANT
PAD OB MATERNITY 4.3X12.25 (PERSONAL CARE ITEMS) ×1 IMPLANT
PAD PREP 24X41 OB/GYN DISP (PERSONAL CARE ITEMS) ×1 IMPLANT
SCRUB CHG 4% DYNA-HEX 4OZ (MISCELLANEOUS) ×1 IMPLANT
SUT MNCRL AB 4-0 PS2 18 (SUTURE) ×1 IMPLANT
SUT PLAIN 2 0 XLH (SUTURE) IMPLANT
SUT VIC AB 0 CT1 36 (SUTURE) ×4 IMPLANT
SUT VIC AB 0 CTX 36 (SUTURE) ×1
SUT VIC AB 0 CTX36XBRD ANBCTRL (SUTURE) IMPLANT
SUT VIC AB 3-0 SH 27 (SUTURE) ×1
SUT VIC AB 3-0 SH 27X BRD (SUTURE) ×1 IMPLANT
SYSTEM CONTND EXTRCTN KII BLLN (MISCELLANEOUS) IMPLANT
TAPE PAPER 3X10 WHT MICROPORE (GAUZE/BANDAGES/DRESSINGS) IMPLANT
TRAP FLUID SMOKE EVACUATOR (MISCELLANEOUS) ×1 IMPLANT
WATER STERILE IRR 500ML POUR (IV SOLUTION) ×1 IMPLANT

## 2022-09-04 NOTE — H&P (Signed)
History and Physical   HPI  Jill Kelly is a 32 y.o. G2P0010 at 34w6dEstimated Date of Delivery: 09/05/22 who is being admitted for induction of labor for BMI>40. She has had mild ctx at home. She denies LOF and vaginal bleeding. Pregnancy was complicated by late entry to care, elevated BMI, uterine fibroids, and tobacco use. She has received one dose of misoprostol and had a prolonged deceleration. A fluid bolus and terbutaline were administered with good recovery of the FHR. Dr. EAmalia Haileyto bedside to evaluate.   OB History  OB History  Gravida Para Term Preterm AB Living  2 0 0 0 1 0  SAB IAB Ectopic Multiple Live Births  0 1 0 0 0    # Outcome Date GA Lbr Len/2nd Weight Sex Delivery Anes PTL Lv  2 Current           1 IAB 2007            PROBLEM LIST  Pregnancy complications or risks: Patient Active Problem List   Diagnosis Date Noted   Labor and delivery, indication for care 09/04/2022   Decreased fetal movement 09/02/2022   [redacted] weeks gestation of pregnancy 08/21/2022   [redacted] weeks gestation of pregnancy 08/07/2022   Supervision of high risk pregnancy in third trimester 08/07/2022   Anxiety disorder affecting pregnancy, antepartum 04/16/2022   Late prenatal care affecting pregnancy in second trimester 04/16/2022   Obesity in pregnancy 04/16/2022   Uterine fibroids affecting pregnancy in third trimester 04/16/2022   Tobacco abuse 04/16/2022   Supervision of high risk pregnancy, antepartum 02/05/2022    Prenatal labs and studies: ABO, Rh: --/--/A POS (01/17 0042) Antibody: NEG (01/17 0042) Rubella: 3.88 (06/21 0922) RPR: Non Reactive (10/24 1109)  HBsAg: Negative (06/21 0922)  HIV: Non Reactive (06/21 08250  GNLZ:JQBHALPF/- (01/04 1053)   Past Medical History:  Diagnosis Date   Fibroids    uterine fibroids- dx 09/2021   Medical history non-contributory      Past Surgical History:  Procedure Laterality Date   NO PAST SURGERIES       Medications     Current Discharge Medication List     CONTINUE these medications which have NOT CHANGED   Details  prenatal vitamin w/FE, FA (NATACHEW) 29-1 MG CHEW chewable tablet Chew 1 tablet by mouth daily at 12 noon. Qty: 30 tablet, Refills: 11   Associated Diagnoses: Supervision of other high risk pregnancies, second trimester         Allergies  Patient has no known allergies.  Review of Systems  Pertinent items noted in HPI and remainder of comprehensive ROS otherwise negative.  Physical Exam  BP 139/79 (BP Location: Left Arm)   Pulse 83   Temp 98.6 F (37 C) (Oral)   Resp 18   LMP 10/22/2021 (Approximate)   Lungs:  CTAB Cardio: RRR without M/R/G Abd: Soft, gravid, NT Presentation: cephalic Extremities: ++7/+9pitting edema to shins CERVIX: Dilation: Closed Effacement (%): Thick Exam by:: TPatsy Baltimore RN  See Prenatal records for more detailed PE.     FHR:  Baseline: 150 Variability: moderate Accelerations: present Decelerations: intermittent late, variable Toco: regular, every 2-3 minutes Category 1/2  Test Results  Results for orders placed or performed during the hospital encounter of 09/04/22 (from the past 24 hour(s))  CBC     Status: Abnormal   Collection Time: 09/04/22 12:42 AM  Result Value Ref Range   WBC 7.5 4.0 - 10.5 K/uL   RBC 3.84 (  L) 3.87 - 5.11 MIL/uL   Hemoglobin 11.6 (L) 12.0 - 15.0 g/dL   HCT 33.9 (L) 36.0 - 46.0 %   MCV 88.3 80.0 - 100.0 fL   MCH 30.2 26.0 - 34.0 pg   MCHC 34.2 30.0 - 36.0 g/dL   RDW 13.2 11.5 - 15.5 %   Platelets 272 150 - 400 K/uL   nRBC 0.0 0.0 - 0.2 %  Type and screen     Status: None   Collection Time: 09/04/22 12:42 AM  Result Value Ref Range   ABO/RH(D) A POS    Antibody Screen NEG    Sample Expiration      09/07/2022,2359 Performed at [facility], [address], [place], [place] [address]    Group B Strep negative  Assessment   G2P0010 at 25w6dEstimated Date of Delivery: 09/05/22   Reassuring maternal/fetal status.  Patient Active Problem List   Diagnosis Date Noted   Labor and delivery, indication for care 09/04/2022   Decreased fetal movement 09/02/2022   [redacted] weeks gestation of pregnancy 08/21/2022   [redacted] weeks gestation of pregnancy 08/07/2022   Supervision of high risk pregnancy in third trimester 08/07/2022   Anxiety disorder affecting pregnancy, antepartum 04/16/2022   Late prenatal care affecting pregnancy in second trimester 04/16/2022   Obesity in pregnancy 04/16/2022   Uterine fibroids affecting pregnancy in third trimester 04/16/2022   Tobacco abuse 04/16/2022   Supervision of high risk pregnancy, antepartum 02/05/2022    Plan  1. Admit to L&D for cervical ripening and IOL   2. EFM: Continuous -- Has been Category 2, currently Category 1 3. Pharmacologic pain relief if desired.   4. Admission labs  5. Cook catheter placement at next check if more cervical dilation  6. Anticipate NSVD 7. Dr. EAmalia Haileynotified of admission  MLloyd Huger C[facility] Surgery Center1/17/2024 2:57 AM

## 2022-09-04 NOTE — Progress Notes (Signed)
LABOR NOTE   SUBJECTIVE:   Jill Kelly is a 32 y.o.  G2P0010  at 25w6dwhose labor is being induced for elevated BMI. I was called to the bedside to assess during a prolonged decel after her epidural was placed. Baseline returned to normal after she received terbutaline, a fluid bolus, and changed positions. She has made cervical change and is beginning to feel comfortable with her epidural.    Analgesia: Epidural  OBJECTIVE:  BP (!) 123/51 (BP Location: Right Arm)   Pulse 89   Temp 98.1 F (36.7 C) (Axillary)   Resp 18   LMP 10/22/2021 (Approximate)   SpO2 96%  No intake/output data recorded.  SVE:   Dilation: 4.5 Effacement (%): 80 Station: -1 Exam by:: Shahmeer Bunn CNM CONTRACTIONS: regular, every 2-3 minutes FHR: Fetal heart tracing reviewed. Baseline: 155 Variability: moderate, minimal Accelerations: Decelerations:late and early Category 2  Labs: Lab Results  Component Value Date   WBC 7.5 09/04/2022   HGB 11.6 (L) 09/04/2022   HCT 33.9 (L) 09/04/2022   MCV 88.3 09/04/2022   PLT 272 09/04/2022    ASSESSMENT: 1)  Induction of labor, progressing well after 1 dose of misoprostol      Category 2 tracing      Coping: better with epidural     Membranes: intact   Principal Problem:   Labor and delivery, indication for care   PLAN: Continue present management Frequent position changes Anticipate NSVD Dr. EAmalia Haileyupdated  MLloyd Huger CNM 09/04/2022 6:37 AM

## 2022-09-04 NOTE — Progress Notes (Signed)
[facility] Attending Progress note  Patient is a 32 y.o. G2P0010 female at 37w6dwho presented for IOL secondary to BMI.  Also with large uterine fibroid.  Patient has been pushing for approximately 2.5 hours (with 30-40 min break in between) with minimal descent.  Called to evaluate by JRod Can CNM.  Contractions have been adequate, however some coupling at times.  Minimal descent with pushing, cervix remains 9.5/100/0 with caput at +1.  Fetal tracing has remained Category II throughout labor.  Upon my assessment, patient with modest caput, also patient with what feels to be an anthropoid pelvis.  Epidural in place, however patient is feeling contractions.  Discussed need for progression to C-section at this time.  The risks of surgery were discussed with the patient including but were not limited to: bleeding which may require transfusion or reoperation; infection which may require antibiotics; injury to bowel, bladder, ureters or other surrounding organs; injury to the fetus; need for additional procedures including hysterectomy in the event of a life-threatening hemorrhage; formation of adhesions; placental abnormalities with subsequent pregnancies; incisional problems; thromboembolic phenomenon and other postoperative/anesthesia complications.  The patient concurred with the proposed plan, giving informed written consent for the procedure.   Patient has been NPO except clears, she will remain NPO for procedure. Anesthesia and OR aware. Preoperative prophylactic antibiotics and SCDs ordered on call to the OR.  To OR when ready.   CRubie Maid MD [facility]

## 2022-09-04 NOTE — Progress Notes (Signed)
Intrapartum Progress Note  S: patient notes feeling a lot of pressure.  Notes not feeling contractions  O: Blood pressure (!) 108/50, pulse 99, temperature (!) 97.5 F (36.4 C), temperature source Oral, resp. rate 20, height '5\' 2"'$  (1.575 m), weight 123.4 kg, last menstrual period 10/22/2021, SpO2 97 %. Gen App: NAD, comfortable Abdomen: soft, gravid FHT: baseline 150 bpm.  Accels present.  Decels present - intermittent late decelerations . moderate in degree variability.   Tocometer: contractions q 1-2 minutes, irregular Cervix: 5/90/-1/. Caput present Extremities: Nontender, no edema.  Pitocin: None  Labs:  No new labs  Assessment:  1: SIUP at 33w6d2. Fibroid uterus 3. Obesity in pregnancy 4. Category II tracing   Plan:  1. Attempted AROM with scant fluid pressure.  IUPC placed.  2. Patient counseled on tracing, possible need for C-section if tracing does not improve.    CRubie Maid MD 09/04/2022 8:33 AM

## 2022-09-04 NOTE — Progress Notes (Signed)
LABOR NOTE   SUBJECTIVE:   Jill Kelly is a 32 y.o.  G2P0010  at 50w6dwhose labor is being induced for elevated BMI. She is having painful contractions and is having difficulty coping. She is making cervical change. She had intermittent late and variable decelerations with moderate variability between 0300-0340  Analgesia: IV pain meds  OBJECTIVE:  BP (!) 123/51 (BP Location: Right Arm)   Pulse 89   Temp 98.1 F (36.7 C) (Axillary)   Resp 18   LMP 10/22/2021 (Approximate)  No intake/output data recorded.  SVE:   Dilation: 2 Effacement (%): 70 Station: -2 Exam by:: TPatsy Baltimore RN CONTRACTIONS: regular, every 1.5-4 minutes, moderate to palpation FHR: Fetal heart tracing reviewed. Baseline: 140 Variability: moderate, minimal Accelerations: no Decelerations:variable, late, and early Category 1/2  Labs: Lab Results  Component Value Date   WBC 7.5 09/04/2022   HGB 11.6 (L) 09/04/2022   HCT 33.9 (L) 09/04/2022   MCV 88.3 09/04/2022   PLT 272 09/04/2022    ASSESSMENT: 1)  Induction of labor due to elevated BMI, making cervical change     Coping: Poor     Membranes: intact  Principal Problem:   Labor and delivery, indication for care   PLAN: Continue present management IV pain medication  Consider Pitocin augmentation as needed Anticipate NSVD Dr. EAmalia Haileyupdated  MLloyd Huger CNM 09/04/2022 5:02 AM

## 2022-09-04 NOTE — Transfer of Care (Signed)
Immediate Anesthesia Transfer of Care Note  Patient: Jill Kelly  Procedure(s) Performed: CESAREAN SECTION  Patient Location: Mother/Baby  Anesthesia Type:Spinal  Level of Consciousness: awake, alert , and oriented  Airway & Oxygen Therapy: Patient Spontanous Breathing  Post-op Assessment: Report given to RN and Post -op Vital signs reviewed and stable  Post vital signs: Reviewed  Last Vitals:  Vitals Value Taken Time  BP 151/78   Temp 97   Pulse 94   Resp 16   SpO2 96     Last Pain:  Vitals:   09/04/22 1530  TempSrc: Axillary  PainSc:       Patients Stated Pain Goal: 0 (47/18/55 0158)  Complications: No notable events documented.

## 2022-09-04 NOTE — Anesthesia Procedure Notes (Signed)
Epidural Patient location during procedure: OB  Staffing Anesthesiologist: Arita Miss, MD Performed: anesthesiologist   Preanesthetic Checklist Completed: patient identified, IV checked, site marked, risks and benefits discussed, surgical consent, monitors and equipment checked, pre-op evaluation and timeout performed  Epidural Patient position: sitting Prep: ChloraPrep Patient monitoring: heart rate, continuous pulse ox and blood pressure Approach: midline Location: L2-L3 Injection technique: LOR saline  Needle:  Needle type: Tuohy  Needle gauge: 17 G Needle length: 9 cm Needle insertion depth: 9 cm Catheter type: closed end flexible Catheter size: 19 Gauge Catheter at skin depth: 14 cm Test dose: negative and 1.5% lidocaine with Epi 1:200 K  Assessment Sensory level: T10 Events: blood not aspirated, no cerebrospinal fluid, injection not painful, no injection resistance, no paresthesia and negative IV test  Additional Notes First epidural attempt, took 3 separate tries - difficult epidural placement due to body habitus. Educated about increased risks below due to high BMI  Pt. Evaluated and documentation done after procedure finished. Patient identified. Risks/Benefits/Options discussed with patient including but not limited to bleeding, infection, nerve damage, paralysis, failed block, incomplete pain control, headache, blood pressure changes, nausea, vomiting, reactions to medication both or allergic, itching and postpartum back pain. Confirmed with bedside nurse the patient's most recent platelet count. Confirmed with patient that they are not currently taking any anticoagulation, have any bleeding history or any family history of bleeding disorders. Patient expressed understanding and wished to proceed. All questions were answered. Sterile technique was used throughout the entire procedure. Please see nursing notes for vital signs. Test dose was given through epidural  catheter and negative prior to continuing to dose epidural or start infusion. Warning signs of high block given to the patient including shortness of breath, tingling/numbness in hands, complete motor block, or any concerning symptoms with instructions to call for help. Patient was given instructions on fall risk and not to get out of bed. All questions and concerns addressed with instructions to call with any issues or inadequate analgesia.     Patient tolerated the insertion well without immediate complications.  Reason for block: procedure for painReason for block:procedure for pain

## 2022-09-04 NOTE — Progress Notes (Addendum)
  Labor Progress Note   32 y.o. G2P0010 @ [redacted]w[redacted]d, admitted for  Pregnancy, Labor Management. IOL/BMI  Subjective:  Comfortable with epidura  Objective:  BP (!) 115/37   Pulse 90   Temp (!) 97.5 F (36.4 C) (Oral)   Resp 20   Ht '5\' 2"'$  (1.575 m)   Wt 123.4 kg   LMP 10/22/2021 (Approximate)   SpO2 97%   BMI 49.75 kg/m  Abd: gravid, ND, FHT present, mild tenderness on exam Extr: trace to 1+ bilateral pedal edema SVE: per RN B. Nielsen 6.5 cm  EFM: FHR: 150 bpm, variability: moderate,  accelerations:  Present,  decelerations:  Present late variables and decels, decel to 60 for 1 minute at 9:03 Toco: Frequency: Every 2-3 minutes Labs: I have reviewed the patient's lab results.   Assessment & Plan:  G2P0010 @ 322w6dadmitted for  Pregnancy and Labor/Delivery Management  1. Pain management: epidural. 2. FWB: FHT category II.  3. ID: GBS negative 4. Labor management: position changes, amnioinfusion started  All discussed with patient, see orders   JaRod CanCNCayugaroup 09/04/2022  9:11 AM

## 2022-09-04 NOTE — Op Note (Addendum)
Cesarean Section Procedure Note  Indications: failure to progress: arrest of descent and non-reassuring fetal status  Pre-operative Diagnosis: 39 week 6 day pregnancy, obesity in pregnancy (BMI 35), non-reassuring fetal tracing, arrest of descent secondary to cephalopelvic disproportion, fibroid uterus  Post-operative Diagnosis: Same  Surgeon: Rubie Maid, MD  Assistants:  Rod Can, CNM  Procedure: Primary low transverse Cesarean Section  Anesthesia: Spinal anesthesia  Findings: Female infant, cephalic presentation, 6629 grams, with Apgar scores of 7 at one minute and 9 at five minutes. Intact placenta with 3 vessel cord.  Clear amniotic fluid at amniotomy (s/p amnioinfusion) The uterine outline had irregular border, with large fundal fibroid palpated. Tubes appeared normal, unable to visualize ovaries as uterus was not exteriorized.   Procedure Details: The patient was seen in the Holding Room. The risks, benefits, complications, treatment options, and expected outcomes were discussed with the patient.  The patient concurred with the proposed plan, giving informed consent.  The site of surgery properly noted/marked. The patient was taken to the Operating Room, identified as Jill Kelly and the procedure verified as C-Section Delivery. A Time Out was held and the above information confirmed.  Epidural anesthesia was not adequate, so the decision was made to remove the epidural and place her under spinal anesthesia. After induction of anesthesia, the patient was draped and prepped in the usual sterile manner. Anesthesia was tested and noted to be adequate. A Pfannenstiel incision was made and carried down through the subcutaneous tissue to the fascia. Fascial incision was made and extended transversely. The fascia was separated from the underlying rectus tissue superiorly and inferiorly. The peritoneum was identified and entered. Peritoneal incision was extended longitudinally.  The surgical assist was able to provide retraction to allow for clear visualization of surgical site. An Alexis retractor was placed in the abdomen for additional retraction. The utero-vesical peritoneal reflection was incised transversely and the bladder flap was bluntly freed from the lower uterine segment. A low transverse uterine incision was made. Delivered from cephalic presentation was a 3620 gram Female with Apgar scores of 7 at one minute and 9 at five minutes.  The assistant was able to apply adequate fundal pressure to allow for successful delivery of the fetus. After the umbilical cord was clamped and cut, cord blood was obtained for evaluation. Delayed cord clamping was observed. The placenta was removed intact and appeared normal. The uterus was unable to be exteriorized due to fundal fibroid.  The uterus was cleared of all clots and debris.  The uterine incision was closed with running locked sutures of 0-Vicryl.  A second suture of 0-Vicryl was used in an imbricating layer.  A figure-of-eight suture was placed at the left lateral edge of the incision to achieve hemostasis. Hemostasis was observed. The pericolic gutters were cleared of all clots and debris.  The fascia was then grasped with Kocher clamps, and injected with a total of 30 ml of 0.5% Marcaine. The fascia was then reapproximated with a running suture of 0-Vicryl. The subcutaneous fat layer was reapproximated with 2-0 Vicryl. The skin was reapproximated with 4-0 Monocryl.  The incision was covered with steri-strips and a pressure dressing. A lidocaine patch was then placed over the incision.   Instrument, sponge, and needle counts were correct prior the abdominal closure and at the conclusion of the case.    An experienced assistant was required given the standard of surgical care given the complexity of the case.  This assistant was needed for exposure, dissection, suctioning,  retraction, instrument exchange, and for overall help during  the procedure.  Estimated Blood Loss:  1015 ml      Drains: foley catheter to gravity drainage, 200 ml of clear urine at end of the procedure         Total IV Fluids:  800 ml  Specimens: None         Implants: None         Complications:  None; patient tolerated the procedure well.         Disposition: PACU - hemodynamically stable.         Condition: Stable   Rubie Maid, MD [facility] at [facility]

## 2022-09-04 NOTE — Anesthesia Procedure Notes (Signed)
Spinal  Patient location during procedure: OB Start time: 09/04/2022 6:16 PM Staffing Performed: resident/CRNA  Resident/CRNA: Johnna Acosta, CRNA Performed by: Lerry Liner, CRNA Authorized by: Darrin Nipper, MD   Preanesthetic Checklist Completed: patient identified, IV checked, site marked, risks and benefits discussed, surgical consent, monitors and equipment checked, pre-op evaluation and timeout performed Spinal Block Patient position: sitting Prep: ChloraPrep Patient monitoring: heart rate, continuous pulse ox and blood pressure Approach: midline Location: L3-4 Injection technique: single-shot Needle Needle type: Pencan  Needle gauge: 24 G Assessment Events: CSF return Additional Notes Atruamtic attempt x1.  Negative heme, paresthesia, no pain with injection.  Good free flow CSF pre/post injection.  Pt tolerated well.

## 2022-09-04 NOTE — Anesthesia Preprocedure Evaluation (Addendum)
Anesthesia Evaluation  Patient identified by MRN, date of birth, ID band Patient awake    Reviewed: Allergy & Precautions, NPO status , Patient's Chart, lab work & pertinent test results  History of Anesthesia Complications Negative for: history of anesthetic complications  Airway Mallampati: III  TM Distance: >3 FB Neck ROM: Full    Dental no notable dental hx. (+) Teeth Intact   Pulmonary Current Smoker and Patient abstained from smoking.   Pulmonary exam normal breath sounds clear to auscultation       Cardiovascular Exercise Tolerance: Good negative cardio ROS (-) dysrhythmias  Rhythm:Regular Rate:Normal - Systolic murmurs    Neuro/Psych  PSYCHIATRIC DISORDERS Anxiety     negative neurological ROS     GI/Hepatic   Endo/Other    Morbid obesity  Renal/GU negative Renal ROS     Musculoskeletal   Abdominal  (+) + obese  Peds  Hematology   Anesthesia Other Findings   Reproductive/Obstetrics (+) Pregnancy                              Anesthesia Physical Anesthesia Plan  ASA: 3  Anesthesia Plan: Spinal   Post-op Pain Management:    Induction:   PONV Risk Score and Plan: 2 and Treatment may vary due to age or medical condition and Ondansetron  Airway Management Planned: Natural Airway  Additional Equipment:   Intra-op Plan:   Post-operative Plan:   Informed Consent: I have reviewed the patients History and Physical, chart, labs and discussed the procedure including the risks, benefits and alternatives for the proposed anesthesia with the patient or authorized representative who has indicated his/her understanding and acceptance.     Dental Advisory Given  Plan Discussed with: Surgeon  Anesthesia Plan Comments: (Epidural to c-section for failure to progress.  Plan for spinal since epidural not functioning well at the moment.    Patient reports no bleeding problems and  no anticoagulant use.  Plan for spinal with backup GA.  Patient consented for risks of anesthesia including but not limited to:  - adverse reactions to medications - damage to eyes, teeth, lips or other oral mucosa - nerve damage due to positioning  - risk of bleeding, infection and or nerve damage from spinal that could lead to paralysis - risk of headache or failed spinal - damage to teeth, lips or other oral mucosa - sore throat or hoarseness - damage to heart, brain, nerves, lungs, other parts of body or loss of life  Patient voiced understanding.)        Anesthesia Quick Evaluation

## 2022-09-04 NOTE — Progress Notes (Signed)
  Labor Progress Note   32 y.o. G2P0010 @ [redacted]w[redacted]d, admitted for  Pregnancy, Labor Management. IOL/BMI  Subjective:  Comfortable with epidural. Able to feel pressure of contractions.   Objective:  BP (!) 116/53   Pulse 95   Temp (!) 100.4 F (38 C) (Axillary)   Resp 20   Ht '5\' 2"'$  (1.575 m)   Wt 123.4 kg   LMP 10/22/2021 (Approximate)   SpO2 97%   BMI 49.75 kg/m  Abd: gravid, ND, FHT present, mild tenderness on exam Extr: trace to 1+ bilateral pedal edema SVE: CERVIX: complete (sliver of anterior lip goes away with push) dilated, 100 effaced, +1 station/caput, great pushing effort  EFM: FHR: 150 bpm, variability: moderate,  accelerations:  Present,  decelerations:  Present early/late (usually following coupling/tripling contractions) Toco: Frequency: Every 1-3 minutes Labs: I have reviewed the patient's lab results.   Assessment & Plan:  G2P0010 @ 372w6dadmitted for  Pregnancy and Labor/Delivery Management  1. Pain management: epidural. 2. FWB: FHT category II.  3. ID: GBS negative 4. Labor management: had 100.4 fever, tylenol given, pushing for past 35 minutes. Now laboring down, allow for molding of fetal skull  All discussed with patient, see orders   JaRod CanCNTiskilwaroup 09/04/2022  3:13 PM

## 2022-09-05 ENCOUNTER — Encounter: Payer: Self-pay | Admitting: Obstetrics and Gynecology

## 2022-09-05 LAB — CBC
HCT: 31.6 % — ABNORMAL LOW (ref 36.0–46.0)
Hemoglobin: 10.6 g/dL — ABNORMAL LOW (ref 12.0–15.0)
MCH: 29.9 pg (ref 26.0–34.0)
MCHC: 33.5 g/dL (ref 30.0–36.0)
MCV: 89 fL (ref 80.0–100.0)
Platelets: 264 10*3/uL (ref 150–400)
RBC: 3.55 MIL/uL — ABNORMAL LOW (ref 3.87–5.11)
RDW: 13.2 % (ref 11.5–15.5)
WBC: 16.5 10*3/uL — ABNORMAL HIGH (ref 4.0–10.5)
nRBC: 0 % (ref 0.0–0.2)

## 2022-09-05 NOTE — Progress Notes (Signed)
Postpartum Day # 1: Cesarean Delivery (with PPH)  Subjective: Patient reports tolerating PO and + flatus.  Does report incisional pain but is tolerable.  Has not yet voided, however had good output with catheter earlier this morning. Is planning to breast feed. Bleeding is normal.   Objective: Vital signs in last 24 hours: Temp:  [97.5 F (36.4 C)-100.4 F (38 C)] 98 F (36.7 C) (01/18 1201) Pulse Rate:  [82-95] 84 (01/18 1201) Resp:  [18-20] 18 (01/18 1201) BP: (117-154)/(51-93) 122/67 (01/18 1201) SpO2:  [97 %-100 %] 100 % (01/18 1201)  Physical Exam:  General: alert and no distress Lungs: clear to auscultation bilaterally Breasts: normal appearance, no masses or tenderness Heart: regular rate and rhythm, S1, S2 normal, no murmur, click, rub or gallop Abdomen: soft, non-tender; bowel sounds normal; no masses,  no organomegaly Pelvis: Lochia appropriate, Uterine Fundus firm, Incision: healing well, no significant drainage, no dehiscence, no significant erythema Extremities: DVT Evaluation: No evidence of DVT seen on physical exam. Negative Homan's sign. No cords or calf tenderness.  Recent Labs    09/04/22 0042 09/05/22 0444  HGB 11.6* 10.6*  HCT 33.9* 31.6*    Assessment/Plan: Status post Cesarean section. Doing well postoperatively.  Regular diet as tolerated Continue PO pain management Desires circumcision for female infant. Continue current care. Contraception undecided.  Plan for discharge tomorrow   Rubie Maid, MD [facility] at [facility]

## 2022-09-06 MED ORDER — IBUPROFEN 800 MG PO TABS: 800.0000 mg | ORAL_TABLET | Freq: Three times a day (TID) | ORAL | 0 refills | Status: DC | PRN

## 2022-09-06 NOTE — Anesthesia Postprocedure Evaluation (Signed)
Anesthesia Post Note  Patient: Keierra Nudo  Procedure(s) Performed: Frederica  Patient location during evaluation: Mother Baby Anesthesia Type: Spinal Level of consciousness: oriented and awake and alert Pain management: pain level controlled Vital Signs Assessment: post-procedure vital signs reviewed and stable Respiratory status: spontaneous breathing and respiratory function stable Cardiovascular status: blood pressure returned to baseline and stable Postop Assessment: no headache, no backache, no apparent nausea or vomiting and able to ambulate Anesthetic complications: no   No notable events documented.   Last Vitals:  Vitals:   09/05/22 2322 09/06/22 0349  BP: 139/78 111/68  Pulse: 91 (!) 57  Resp: 20 20  Temp: 37.2 C 36.8 C  SpO2: 99% 97%    Last Pain:  Vitals:   09/06/22 0349  TempSrc: Oral  PainSc:                  Darrin Nipper

## 2022-09-06 NOTE — Discharge Summary (Signed)
OB Discharge Summary     Patient Name: Jill Kelly DOB: Dec 05, 1990 MRN: [id number]  Date of admission: 09/04/2022 Delivering MD: Maggie Font, CNM  Date of Delivery: 09/06/2022  Date of discharge: 09/06/2022  Admitting diagnosis: Labor and delivery, indication for care [O75.9] Intrauterine pregnancy: [redacted]w[redacted]d    Secondary diagnosis: obesit, uterine fibroid, tobacco use.    Discharge diagnosis: Term Pregnancy Delivered, Anemia, and PPH                                                                                                Post partum procedures: none  Augmentation: Cytotec, AROM  Complications: NRFHTs, remote from delivery, c/s section for these reasons. [place] [facility]:  Induction of Labor With Cesarean Section   [age].o. yo G2P1011 at 3937w6ds admitted to the hospital 09/04/2022 for induction of labor. Patient had a labor course significant for NRFHTs. The patient went for cesarean section due to Arrest of Dilation and Non-Reassuring FHR. Delivery details are as follows: Membrane Rupture Time/Date: 8:00 AM ,09/04/2022   Delivery Method:C-Section, Low Transverse  Details of operation can be found in separate operative Note.  Patient had a postpartum course without complications. She is ambulating, tolerating a regular diet, passing flatus, and urinating well.  Patient is discharged home in stable condition on 09/06/22.       Has yet to have BM. She is mainly bottle feeding, did nurse once. RN feels she is not interested in nursing although patient said baby wouldn't latch, did discuss need to pump to protect supply and options for lactation support if desired, supplementing with formula. Reports mild vaginal bleeding, denies passing large blood clots. Has had incisional abdomen pain relieved with roxicodone, tylenol and ibuprofen. Reports more gas pain this morning which is improved with walking. Unsure of plans for contraception. Denies  anxiety/depression symptoms. Endorses good support from partner and family.     Newborn Data: Birth date:09/04/2022  Birth time:6:50 PM  Gender:Female  Living status:Living  Apgars:7 ,9  Weight:3620 g                                Subjective:   Physical exam  Vitals:   09/05/22 1612 09/05/22 2322 09/06/22 0349 09/06/22 0817  BP: (!) 98/57 139/78 111/68 (!) 112/50  Pulse: 92 91 (!) 57 85  Resp: '20 20 20 '$ (!) 22  Temp: 98.2 F (36.8 C) 98.9 F (37.2 C) 98.3 F (36.8 C) 98.6 F (37 C)  TempSrc: Oral Oral Oral Oral  SpO2: 97% 99% 97% 99%  Weight:      Height:       General: alert, cooperative, and no distress Breast: soft, non-tender, nipples without breakdown Lochia: appropriate Uterine Fundus: firm Perineum: no erythema or foul odor discharge, minimal edema DVT Evaluation: No evidence of DVT seen on physical exam. Negative Homan's sign. No cords or calf tenderness. No significant calf/ankle edema.  Labs: Lab Results  Component Value Date   WBC 16.5 (H) 09/05/2022  HGB 10.6 (L) 09/05/2022   HCT 31.6 (L) 09/05/2022   MCV 89.0 09/05/2022   PLT 264 09/05/2022    Discharge instruction: in After Visit Summary.  Medications:  Allergies as of 09/06/2022   No Known Allergies      Medication List     TAKE these medications    ibuprofen 800 MG tablet Commonly known as: ADVIL Take 1 tablet (800 mg total) by mouth every 8 (eight) hours as needed.   prenatal vitamin w/FE, FA 29-1 MG Chew chewable tablet Chew 1 tablet by mouth daily at 12 noon.         Activity: Advance as tolerated. Pelvic rest for 6 weeks.   Outpatient follow up:  Follow-up Information     Rubie Maid, MD Follow up.   Specialties: Obstetrics and Gynecology, Radiology Why: 1 and 6 weeks postpartum in clinic. Contact information: [address] [place] [address] [phone]                   Postpartum contraception:  unsure Rhogam Given postpartum: not  indicated Rubella vaccine given postpartum: not indicated TDaP given antepartum or postpartum: given 06/11/22 antenatally  Postpartum edu: Counseled on normal uterine involution and vaginal bleeding postpartum Discussed danger signs including s/sx of infection/excessive bleeding or uncontrolled pain Encouraged to rest when possible and stay well hydrated Encouraged to continue to breastfeed, discussed latching, position changes, cluster feeding, hunger cues, lactogensis II, milk supply, sore nipple management Addressed laceration healing and perineal cleaning with peri bottle Counseled on postpartum anxiety and depression s/sx, when to contact clinic/seek mental health support Pt understands return to fertility, reviewed BCM use/effectiveness/benefits/side effects/risks Advised when to resume increased physical activity, using tampons, intercourse Recommend taking ibuprofen and/or tylenol for pain, percocet Rx will be sent to pharmacy.    Newborn Data: Live born female  Birth Weight: 7 lb 15.7 oz (3620 g) APGAR: 7, 9  Newborn Delivery   Birth date/time: 09/04/2022 18:50:00 Delivery type: C-Section, Low Transverse C-section categorization: Primary       Baby Feeding: Bottle and Breast  Disposition:home with mother   Meredith Staggers, FNP 09/06/2022 1:38 PM

## 2022-09-06 NOTE — Discharge Instructions (Addendum)
Counseled on normal uterine involution and vaginal bleeding postpartum Discussed danger signs including s/sx of infection/excessive bleeding or uncontrolled pain Encouraged to rest when possible and stay well hydrated Encouraged to continue to breastfeed, discussed latching, position changes, cluster feeding, hunger cues, lactogensis II, milk supply, sore nipple management Addressed laceration healing and perineal cleaning with peri bottle Counseled on postpartum anxiety and depression s/sx, when to contact clinic/seek mental health support Pt understands return to fertility, reviewed BCM use/effectiveness/benefits/side effects/risks Advised when to resume increased physical activity, using tampons, intercourse Recommend taking ibuprofen and/or tylenol for pain     Discharge instructions:   Call office if you have any of the following:  headache, visual changes, fever >101.0 F, chills, breast concerns (engorgement, mastitis) excessive vaginal bleeding, incision drainage or problems, leg pain or redness, depression or any other concerns.   Activity: Do not lift > 10 lbs for 6 weeks.  No intercourse or tampons for 6 weeks.  No driving for 1-2 weeks or while taking pain medication. No strenuous activity or heavy lifting for 6 weeks.  No swimming pools, hot tubs or tub baths- showers only.    It is normal to bleed for up to 6 weeks. You should not soak through more than 1 pad in 1 hour.   Continue prenatal vitamin. Increase calories and fluids while breastfeeding.  Your milk will come in, in the next couple of days (right now it is colostrum).  You may have a slight fever when your milk comes in, but it should go away on its own.   If it does not, and rises above 101 F please call the doctor.  You will also feel achy and your breasts will be firm. They will also start to leak.  If you are breastfeeding, continue as you have been and you can pump/express milk for comfort.   For concerns  about your baby, please call your pediatrician For breastfeeding concerns, the lactation consultant can be reached at [phone]  Postpartum blues (feelings of happy one minute and sad another minute) are normal for the first few weeks but if it gets worse let your doctor know.

## 2022-09-06 NOTE — Progress Notes (Signed)
.  Patient discharged home with family.  Discharge instructions, when to follow up, and prescriptions reviewed with patient.  Patient verbalized understanding. Patient will be escorted out by auxiliary.   

## 2022-09-09 ENCOUNTER — Other Ambulatory Visit: Payer: Self-pay

## 2022-09-09 ENCOUNTER — Emergency Department: Payer: Medicaid Other

## 2022-09-09 ENCOUNTER — Emergency Department
Admission: EM | Admit: 2022-09-09 | Discharge: 2022-09-09 | Disposition: A | Discharge status: Home / Self Care | Payer: Medicaid Other | Attending: Emergency Medicine | Admitting: Emergency Medicine

## 2022-09-09 ENCOUNTER — Encounter: Payer: Self-pay | Admitting: Emergency Medicine

## 2022-09-09 DIAGNOSIS — G8918 Other acute postprocedural pain: Secondary | ICD-10-CM | POA: Insufficient documentation

## 2022-09-09 DIAGNOSIS — R600 Localized edema: Secondary | ICD-10-CM | POA: Insufficient documentation

## 2022-09-09 DIAGNOSIS — R1909 Other intra-abdominal and pelvic swelling, mass and lump: Secondary | ICD-10-CM | POA: Insufficient documentation

## 2022-09-09 DIAGNOSIS — R609 Edema, unspecified: Secondary | ICD-10-CM

## 2022-09-09 LAB — URINALYSIS, ROUTINE W REFLEX MICROSCOPIC
Bilirubin Urine: NEGATIVE
Glucose, UA: NEGATIVE mg/dL
Ketones, ur: NEGATIVE mg/dL
Nitrite: NEGATIVE
Protein, ur: 100 mg/dL — AB
RBC / HPF: >50 RBC/hpf — ABNORMAL HIGH (ref 0–5)
Specific Gravity, Urine: 1.029 (ref 1.005–1.030)
pH: 5 (ref 5.0–8.0)

## 2022-09-09 LAB — CBC WITH DIFFERENTIAL/PLATELET
Abs Immature Granulocytes: 0.08 10*3/uL — ABNORMAL HIGH (ref 0.00–0.07)
Basophils Absolute: 0 10*3/uL (ref 0.0–0.1)
Basophils Relative: 0 %
Eosinophils Absolute: 0.1 10*3/uL (ref 0.0–0.5)
Eosinophils Relative: 2 %
HCT: 27.9 % — ABNORMAL LOW (ref 36.0–46.0)
Hemoglobin: 9.1 g/dL — ABNORMAL LOW (ref 12.0–15.0)
Immature Granulocytes: 1 %
Lymphocytes Relative: 25 %
Lymphs Abs: 1.8 10*3/uL (ref 0.7–4.0)
MCH: 29.9 pg (ref 26.0–34.0)
MCHC: 32.6 g/dL (ref 30.0–36.0)
MCV: 91.8 fL (ref 80.0–100.0)
Monocytes Absolute: 0.5 10*3/uL (ref 0.1–1.0)
Monocytes Relative: 7 %
Neutro Abs: 4.8 10*3/uL (ref 1.7–7.7)
Neutrophils Relative %: 65 %
Platelets: 308 10*3/uL (ref 150–400)
RBC: 3.04 MIL/uL — ABNORMAL LOW (ref 3.87–5.11)
RDW: 13.2 % (ref 11.5–15.5)
WBC: 7.4 10*3/uL (ref 4.0–10.5)
nRBC: 0 % (ref 0.0–0.2)

## 2022-09-09 LAB — COMPREHENSIVE METABOLIC PANEL
ALT: 34 U/L (ref 0–44)
AST: 40 U/L (ref 15–41)
Albumin: 2.7 g/dL — ABNORMAL LOW (ref 3.5–5.0)
Alkaline Phosphatase: 109 U/L (ref 38–126)
Anion gap: 7 (ref 5–15)
BUN: 12 mg/dL (ref 6–20)
CO2: 26 mmol/L (ref 22–32)
Calcium: 8.1 mg/dL — ABNORMAL LOW (ref 8.9–10.3)
Chloride: 105 mmol/L (ref 98–111)
Creatinine, Ser: 0.67 mg/dL (ref 0.44–1.00)
GFR, Estimated: >60 mL/min (ref >60)
Glucose, Bld: 77 mg/dL (ref 70–99)
Potassium: 3.5 mmol/L (ref 3.5–5.1)
Sodium: 138 mmol/L (ref 135–145)
Total Bilirubin: 0.1 mg/dL — ABNORMAL LOW (ref 0.3–1.2)
Total Protein: 6.3 g/dL — ABNORMAL LOW (ref 6.5–8.1)

## 2022-09-09 LAB — TROPONIN I (HIGH SENSITIVITY)
Troponin I (High Sensitivity): 6 ng/L (ref <18)
Troponin I (High Sensitivity): 6 ng/L (ref <18)

## 2022-09-09 LAB — D-DIMER, QUANTITATIVE: D-Dimer, Quant: 2.91 ug/mL-FEU — ABNORMAL HIGH (ref 0.00–0.50)

## 2022-09-09 LAB — BRAIN NATRIURETIC PEPTIDE: B Natriuretic Peptide: 159.2 pg/mL — ABNORMAL HIGH (ref 0.0–100.0)

## 2022-09-09 MED ORDER — OXYCODONE-ACETAMINOPHEN 5-325 MG PO TABS: 1.0000 | ORAL_TABLET | ORAL | 0 refills | Status: AC | PRN

## 2022-09-09 MED ORDER — IOHEXOL 350 MG/ML SOLN
75.0000 mL | Freq: Once | INTRAVENOUS | Status: AC | PRN
  Administered 2022-09-09: 75 mL via INTRAVENOUS

## 2022-09-09 MED ORDER — FUROSEMIDE 20 MG PO TABS: 20.0000 mg | ORAL_TABLET | Freq: Every day | ORAL | 0 refills | Status: DC

## 2022-09-09 MED ORDER — OXYCODONE-ACETAMINOPHEN 5-325 MG PO TABS
1.0000 | ORAL_TABLET | Freq: Once | ORAL | Status: AC
  Administered 2022-09-09: 1 via ORAL
  Filled 2022-09-09: qty 1

## 2022-09-09 MED ORDER — FUROSEMIDE 10 MG/ML IJ SOLN
40.0000 mg | Freq: Once | INTRAMUSCULAR | Status: AC
  Administered 2022-09-09: 40 mg via INTRAVENOUS
  Filled 2022-09-09: qty 4

## 2022-09-09 NOTE — Discharge Instructions (Addendum)
Take the Lasix as prescribed until you follow-up with Dr. Marcelline Mates.  You may take the Percocet as needed for pain in addition to the ibuprofen that you already have.  Call the office today to schedule your follow-up appointment.  Return to the ER immediately for new, worsening, or persistent severe shortness of breath, chest pain, swelling, abdominal pain, pain around the incision, pus, bleeding, or other drainage, fever or chills, or any other new or worsening symptoms that concern you.

## 2022-09-09 NOTE — ED Notes (Addendum)
Attempts at bladder scan in triage difficult due to Csection incision site. Multiple attempts all showing 89m. Abd appears distended and firm to touch

## 2022-09-09 NOTE — ED Notes (Signed)
Pt resting in bed recliner at this time, NAD noted. Call bell within reach of patient at this time. Pt denies any needs. VSS at this time.

## 2022-09-09 NOTE — ED Triage Notes (Signed)
To triage via wheelchair with c/o generalized edema to BLE to stomach and shortness of breath.  S/p C section on 09/04/22. Pt states edematous during pregnancy but not to this extent.  Pt also states she had only had 4 episodes of urination since being discharged home on 09/06/22

## 2022-09-09 NOTE — ED Notes (Signed)
First Nurse Note: Pt arrives via POV from home with complaints of edema following a C-section on 1/17. Pt states she's "tight" in her legs and pelvis.

## 2022-09-09 NOTE — ED Provider Notes (Signed)
[facility] Provider Note    Event Date/Time   First MD Initiated Contact with Patient 09/09/22 0244     (approximate)   History   Shortness of Breath   HPI  Jill Kelly is a 32 y.o. female G2P2 status post cesarian section on 1/17 for arrest of dilation and non-reassuring FHR who presents with multiple symptoms since her discharge on 1/19.  The patient primarily reports generalized swelling especially in her abdomen and legs which was present during the pregnancy but acutely worsened over the last few days.  She also reports some intermittent shortness of breath especially with exertion and when lying flat.  She also reports persistent pain to the cesarean incision which is worsened with certain positions.  She denies any bleeding or discharge from the wound.  She states that the pain is not relieved by ibuprofen.  She also has not been urinating much over the last few days, approximately once per day.  However she denies any dysuria, hematuria or fever.  I reviewed the past medical records including the OB/GYN discharge summary from 1/19.  The patient had a postpartum course without complications.  She was documented to have been tolerating a regular diet, passing flatus, and urinating without difficulty.   Physical Exam   Triage Vital Signs: ED Triage Vitals  Enc Vitals Group     BP 09/09/22 0209 94/84     Pulse Rate 09/09/22 0209 85     Resp 09/09/22 0209 20     Temp 09/09/22 0209 98.1 F (36.7 C)     Temp Source 09/09/22 0209 Oral     SpO2 09/09/22 0209 99 %     Weight 09/09/22 0210 275 lb (124.7 kg)     Height 09/09/22 0210 '5\' 2"'$  (1.575 m)     Head Circumference --      Peak Flow --      Pain Score 09/09/22 0208 10     Pain Loc --      Pain Edu? --      Excl. in [place]? --     Most recent vital signs: Vitals:   09/09/22 0400 09/09/22 0522  BP: (!) 151/94 137/83  Pulse: 80 77  Resp: (!) 27 (!) 21  Temp:    SpO2:  100%      General: Alert, no acute distress.  CV:  Good peripheral perfusion.  Resp:  Normal effort.  Lungs CTAB. Abd:  Soft with no focal tenderness.  No distention.  Dressing in place over cesarean wound.  No surrounding erythema, induration, or abnormal warmth. Other:  1+ bilateral lower extremity edema.   ED Results / Procedures / Treatments   Labs (all labs ordered are listed, but only abnormal results are displayed) Labs Reviewed  CBC WITH DIFFERENTIAL/PLATELET - Abnormal; Notable for the following components:      Result Value   RBC 3.04 (*)    Hemoglobin 9.1 (*)    HCT 27.9 (*)    Abs Immature Granulocytes 0.08 (*)    All other components within normal limits  COMPREHENSIVE METABOLIC PANEL - Abnormal; Notable for the following components:   Calcium 8.1 (*)    Total Protein 6.3 (*)    Albumin 2.7 (*)    Total Bilirubin 0.1 (*)    All other components within normal limits  URINALYSIS, ROUTINE W REFLEX MICROSCOPIC - Abnormal; Notable for the following components:   Color, Urine YELLOW (*)    APPearance HAZY (*)  Hgb urine dipstick LARGE (*)    Protein, ur 100 (*)    Leukocytes,Ua SMALL (*)    RBC / HPF >50 (*)    Bacteria, UA RARE (*)    All other components within normal limits  D-DIMER, QUANTITATIVE - Abnormal; Notable for the following components:   D-Dimer, Quant 2.91 (*)    All other components within normal limits  BRAIN NATRIURETIC PEPTIDE - Abnormal; Notable for the following components:   B Natriuretic Peptide 159.2 (*)    All other components within normal limits  TROPONIN I (HIGH SENSITIVITY)  TROPONIN I (HIGH SENSITIVITY)     EKG  ED ECG REPORT I, Arta Silence, the attending physician, personally viewed and interpreted this ECG.  Date: 09/09/2022 EKG Time: 02 15 Rate: 82 Rhythm: normal sinus rhythm QRS Axis: Right axis Intervals: normal ST/T Wave abnormalities: normal Narrative Interpretation: no evidence of acute  ischemia    RADIOLOGY  Chest x-ray: I independently viewed and interpreted the images; there is no focal consolidation or edema  CT angio chest: No acute PE.  Trace edema.    PROCEDURES:  Critical Care performed: No  Procedures   MEDICATIONS ORDERED IN ED: Medications  oxyCODONE-acetaminophen (PERCOCET/ROXICET) 5-325 MG per tablet 1 tablet (1 tablet Oral Given 09/09/22 0346)  iohexol (OMNIPAQUE) 350 MG/ML injection 75 mL (75 mLs Intravenous Contrast Given 09/09/22 0414)  furosemide (LASIX) injection 40 mg (40 mg Intravenous Given 09/09/22 0603)     IMPRESSION / MDM / ASSESSMENT AND PLAN / ED COURSE  I reviewed the triage vital signs and the nursing notes.  32 year old female with PMH as noted above and status post cesarean delivery on 1/17 presents with worsened edema, shortness of breath, and persistent incisional pain.  Differential diagnosis includes, but is not limited to, new onset cardiomyopathy/CHF, normal fluid shift related to the pregnancy, preeclampsia, pulmonary embolism, pneumonia.  Chest x-ray shows no acute findings.  Troponin is negative.  D-dimer is elevated.  We will obtain BNP to rule out acute CHF, urinalysis, CT angio, and reassess.  Patient's presentation is most consistent with acute presentation with potential threat to life or bodily function.  The patient is on the cardiac monitor to evaluate for evidence of arrhythmia and/or significant heart rate changes.  ----------------------------------------- 6:36 AM on 09/09/2022 -----------------------------------------  CT shows no evidence of PE.  There is trace edema.  However the BNP is not elevated.    The patient has had some elevated blood pressure readings, but the blood pressure is now normal.  There is a small quantity of protein in the urine but normal platelets and LFTs.  There is no clinical evidence of preeclampsia.  Urinalysis also shows RBCs consistent with postpartum bleeding.  The patient  has rare bacteria and no nitrites.  There is no clinical evidence of UTI.  I consulted and discussed the case with CNM Grandville Silos from [facility].  She recommends starting the patient on Lasix.  She also recommends giving her Percocet for postoperative pain.  She advises that the patient should follow-up this week for her 1 week postpartum visit.  I counseled the patient on the results of the workup and the plan of care.  I will give a dose of Lasix here and a prescription for 1 week of 20 mg Lasix for home as well as the Percocet.  I gave the patient strict return precautions and she expresses understanding.   FINAL CLINICAL IMPRESSION(S) / ED DIAGNOSES   Final diagnoses:  Edema, unspecified type  Postoperative pain     Rx / DC Orders   ED Discharge Orders          Ordered    furosemide (LASIX) 20 MG tablet  Daily        09/09/22 0635    oxyCODONE-acetaminophen (PERCOCET) 5-325 MG tablet  Every 4 hours PRN        09/09/22 6016             Note:  This document was prepared using Dragon voice recognition software and may include unintentional dictation errors.    Arta Silence, MD 09/09/22 [phone]

## 2022-09-10 ENCOUNTER — Ambulatory Visit (INDEPENDENT_AMBULATORY_CARE_PROVIDER_SITE_OTHER): Payer: Medicaid Other | Admitting: Obstetrics and Gynecology

## 2022-09-10 ENCOUNTER — Encounter: Payer: Self-pay | Admitting: Obstetrics and Gynecology

## 2022-09-10 VITALS — BP 143/92 | HR 83 | Resp 16 | Ht 62.5 in | Wt 282.7 lb

## 2022-09-10 DIAGNOSIS — R600 Localized edema: Secondary | ICD-10-CM

## 2022-09-10 DIAGNOSIS — Z1332 Encounter for screening for maternal depression: Secondary | ICD-10-CM

## 2022-09-10 DIAGNOSIS — Z4889 Encounter for other specified surgical aftercare: Secondary | ICD-10-CM

## 2022-09-10 DIAGNOSIS — K5900 Constipation, unspecified: Secondary | ICD-10-CM

## 2022-09-10 MED ORDER — HYDROCHLOROTHIAZIDE 25 MG PO TABS: 25.0000 mg | ORAL_TABLET | Freq: Every day | ORAL | 1 refills | Status: DC

## 2022-09-10 MED ORDER — DOCUSATE SODIUM 100 MG PO CAPS: 100.0000 mg | ORAL_CAPSULE | Freq: Two times a day (BID) | ORAL | 2 refills | Status: DC | PRN

## 2022-09-10 NOTE — Progress Notes (Signed)
OBSTETRICS/GYNECOLOGY POST-OPERATIVE CLINIC VISIT  Subjective:     Jill Kelly is a 32 y.o. female who presents to the clinic 1 weeks status post  Low-Transverse C-Section  arrest of decent. Eating a regular diet without difficulty. Bowel movements are abnormal with mild constipation . Pain is controlled with current analgesics. Medications being used: acetaminophen and ibuprofen (OTC). She complains of a lot of swelling in her legs, ankles and feet. She was seen in the Emergency Room yesterday due to complaints of significant swelling.  She was prescribed Lasix but does not feel like it has helped much yet.  She would like a postpartum recovery support garmet as well.  Patient is not breastfeeding. Denies any significant mood issues.   The following portions of the patient's history were reviewed and updated as appropriate: allergies, current medications, past family history, past medical history, past social history, past surgical history, and problem list.  Review of Systems Pertinent items noted in HPI and remainder of comprehensive ROS otherwise negative.   Objective:   BP (!) 143/92   Pulse 83   Resp 16   Ht 5' 2.5" (1.588 m)   Wt 282 lb 11.2 oz (128.2 kg)   LMP 10/22/2021 (Approximate)   BMI 50.88 kg/m  Body mass index is 50.88 kg/m.  General:  alert and no distress  Abdomen: soft, bowel sounds active, non-tender.  Small amount of pitting edema in pannus.   Incision:   Honeycomb dressing partially adherent.  Removed to reveal incision healing well, no drainage, no erythema, no hernia, no seroma, no swelling, no dehiscence, incision well approximated. Few steri-strips in place.   Extremities:  Significant edema noted bilaterally in feet (+3), up to calves (+2) pitting edema.   Neurologic:  Grossly normal.      Edinburgh Postnatal Depression Scale - 09/10/22 1541       Edinburgh Postnatal Depression Scale:  In the Past 7 Days   I have been able to laugh and see  the funny side of things. 0    I have looked forward with enjoyment to things. 1    I have blamed myself unnecessarily when things went wrong. 0    I have been anxious or worried for no good reason. 0    I have felt scared or panicky for no good reason. 0    Things have been getting on top of me. 1    I have been so unhappy that I have had difficulty sleeping. 0    I have felt sad or miserable. 0    I have been so unhappy that I have been crying. 0    The thought of harming myself has occurred to me. 0    Edinburgh Postnatal Depression Scale Total 2              Labs:  Admission on 09/09/2022, Discharged on 09/09/2022  Component Date Value Ref Range Status   WBC 09/09/2022 7.4  4.0 - 10.5 K/uL Final   RBC 09/09/2022 3.04 (L)  3.87 - 5.11 MIL/uL Final   Hemoglobin 09/09/2022 9.1 (L)  12.0 - 15.0 g/dL Final   HCT 09/09/2022 27.9 (L)  36.0 - 46.0 % Final   MCV 09/09/2022 91.8  80.0 - 100.0 fL Final   MCH 09/09/2022 29.9  26.0 - 34.0 pg Final   MCHC 09/09/2022 32.6  30.0 - 36.0 g/dL Final   RDW 09/09/2022 13.2  11.5 - 15.5 % Final   Platelets 09/09/2022 308  150 - 400 K/uL Final   nRBC 09/09/2022 0.0  0.0 - 0.2 % Final   Neutrophils Relative % 09/09/2022 65  % Final   Neutro Abs 09/09/2022 4.8  1.7 - 7.7 K/uL Final   Lymphocytes Relative 09/09/2022 25  % Final   Lymphs Abs 09/09/2022 1.8  0.7 - 4.0 K/uL Final   Monocytes Relative 09/09/2022 7  % Final   Monocytes Absolute 09/09/2022 0.5  0.1 - 1.0 K/uL Final   Eosinophils Relative 09/09/2022 2  % Final   Eosinophils Absolute 09/09/2022 0.1  0.0 - 0.5 K/uL Final   Basophils Relative 09/09/2022 0  % Final   Basophils Absolute 09/09/2022 0.0  0.0 - 0.1 K/uL Final   Immature Granulocytes 09/09/2022 1  % Final   Abs Immature Granulocytes 09/09/2022 0.08 (H)  0.00 - 0.07 K/uL Final   Performed at [facility], [address] [place], [place] [address]   Sodium 09/09/2022 138  135 - 145 mmol/L Final   Potassium  09/09/2022 3.5  3.5 - 5.1 mmol/L Final   Chloride 09/09/2022 105  98 - 111 mmol/L Final   CO2 09/09/2022 26  22 - 32 mmol/L Final   Glucose, Bld 09/09/2022 77  70 - 99 mg/dL Final   Glucose reference range applies only to samples taken after fasting for at least 8 hours.   BUN 09/09/2022 12  6 - 20 mg/dL Final   Creatinine, Ser 09/09/2022 0.67  0.44 - 1.00 mg/dL Final   Calcium 09/09/2022 8.1 (L)  8.9 - 10.3 mg/dL Final   Total Protein 09/09/2022 6.3 (L)  6.5 - 8.1 g/dL Final   Albumin 09/09/2022 2.7 (L)  3.5 - 5.0 g/dL Final   AST 09/09/2022 40  15 - 41 U/L Final   ALT 09/09/2022 34  0 - 44 U/L Final   Alkaline Phosphatase 09/09/2022 109  38 - 126 U/L Final   Total Bilirubin 09/09/2022 0.1 (L)  0.3 - 1.2 mg/dL Final   GFR, Estimated 09/09/2022 >60  >60 mL/min Final   Comment: (NOTE) Calculated using the CKD-EPI Creatinine Equation (2021)    Anion gap 09/09/2022 7  5 - 15 Final   Performed at [facility], [place], [place] [address]   Troponin I (High Sensitivity) 09/09/2022 6  <18 ng/L Final   Comment: (NOTE) Elevated high sensitivity troponin I (hsTnI) values and significant  changes across serial measurements may suggest ACS but many other  chronic and acute conditions are known to elevate hsTnI results.  Refer to the "Links" section for chest pain algorithms and additional  guidance. Performed at [facility], [place], [place] [address]    Color, Urine 09/09/2022 YELLOW (A)  YELLOW Final   APPearance 09/09/2022 HAZY (A)  CLEAR Final   Specific Gravity, Urine 09/09/2022 1.029  1.005 - 1.030 Final   pH 09/09/2022 5.0  5.0 - 8.0 Final   Glucose, UA 09/09/2022 NEGATIVE  NEGATIVE mg/dL Final   Hgb urine dipstick 09/09/2022 LARGE (A)  NEGATIVE Final   Bilirubin Urine 09/09/2022 NEGATIVE  NEGATIVE Final   Ketones, ur 09/09/2022 NEGATIVE  NEGATIVE mg/dL Final   Protein, ur 09/09/2022 100 (A)  NEGATIVE mg/dL Final   Nitrite  09/09/2022 NEGATIVE  NEGATIVE Final   Leukocytes,Ua 09/09/2022 SMALL (A)  NEGATIVE Final   RBC / HPF 09/09/2022 >50 (H)  0 - 5 RBC/hpf Final   WBC, UA 09/09/2022 11-20  0 - 5 WBC/hpf Final   Bacteria,  UA 09/09/2022 RARE (A)  NONE SEEN Final   Squamous Epithelial / HPF 09/09/2022 0-5  0 - 5 /HPF Final   Mucus 09/09/2022 PRESENT   Final   Performed at [facility], [address] [place], [place] [address]   D-Dimer, Quant 09/09/2022 2.91 (H)  0.00 - 0.50 ug/mL-FEU Final   Comment: (NOTE) At the manufacturer cut-off value of 0.5 g/mL FEU, this assay has a negative predictive value of 95-100%.This assay is intended for use in conjunction with a clinical pretest probability (PTP) assessment model to exclude pulmonary embolism (PE) and deep venous thrombosis (DVT) in outpatients suspected of PE or DVT. Results should be correlated with clinical presentation. Performed at [facility], [place], [place] [address]    B Natriuretic Peptide 09/09/2022 159.2 (H)  0.0 - 100.0 pg/mL Final   Performed at [facility], [place], [place] [address]   Troponin I (High Sensitivity) 09/09/2022 6  <18 ng/L Final   Comment: (NOTE) Elevated high sensitivity troponin I (hsTnI) values and significant  changes across serial measurements may suggest ACS but many other  chronic and acute conditions are known to elevate hsTnI results.  Refer to the "Links" section for chest pain algorithms and additional  guidance. Performed at [facility] [address] [place], [place] [address]     Assessment:   Patient s/p Low-Transverse C-Section (surgery)  Lower extremity edema Elevated BP   Plan:   1. Continue any current medications as instructed by provider.  2. Wound care discussed. 3. Elevated BP and lower extremity edema, added HCTZ for concerns of developing PP HTN.  Continue Lasix. Denies PIH symptoms, recent labs performed in ER  yesterday negative for pre-eclampsia however BNP elevated. Advised on compression stockings and elevation of legs when resting.  4. Activity restrictions: no bending, stooping, or squatting, no lifting more than 10-15 pounds, and pelvic rest x 5 weeks.  5. Anticipated return to work: 5 weeks, if applicable. 6. Follow up: 1 week for repeat BP check and f/u edema.    Rubie Maid, MD [place]

## 2022-09-10 NOTE — Patient Instructions (Signed)

## 2022-09-13 ENCOUNTER — Telehealth: Payer: Self-pay

## 2022-09-13 NOTE — Telephone Encounter (Signed)
Jill Kelly called triage requesting she needs her OxyCodone refill.   Please advise?

## 2022-09-16 ENCOUNTER — Telehealth: Payer: Self-pay

## 2022-09-16 MED ORDER — TRAMADOL HCL 50 MG PO TABS: 50.0000 mg | ORAL_TABLET | Freq: Four times a day (QID) | ORAL | 0 refills | Status: DC | PRN

## 2022-09-16 NOTE — Telephone Encounter (Signed)
Will send Tramadol for pain.   Dr. Marcelline Mates

## 2022-09-16 NOTE — Addendum Note (Signed)
Addended by: Augusto Gamble on: 09/16/2022 11:07 AM   Modules accepted: Orders

## 2022-09-16 NOTE — Telephone Encounter (Signed)
Please see if patient still needs this and how her pain is doing.  Did not see this message Friday since I was out of the office that afternoon. If she still needs this, then I can refill.

## 2022-09-16 NOTE — Telephone Encounter (Signed)
Called [facility] and left voicemail to see how she was feeling and how the pain was going.

## 2022-09-16 NOTE — Telephone Encounter (Signed)
Patient returning [facility] call. Advised reason for the call was to follow up on her pain. Patient reports she ran out of oxycodone on Friday. She used Ibuprofen all weekend. She has one pill left. She is taking it 2-3 times a day. Reports pain is better, but rates level 7-8/10 at it's worst when medicine has worn off. States subsides within ~30 minutes after taking next dose. She would like something, but is not requesting oxycodone. States it still hurts in the middle of her stomach when she gets up. Advised Dr. Marcelline Mates is in surgery today, but will check messages as she is able.

## 2022-09-16 NOTE — Telephone Encounter (Signed)
Patient aware rx sent to pharmacy.  

## 2022-09-18 NOTE — Progress Notes (Unsigned)
    OBSTETRICS/GYNECOLOGY POST-OPERATIVE CLINIC VISIT  Subjective:     Shaquel Harjit Douds is a 32 y.o. female who presents to the clinic 2 weeks status post Low-Transverse C-Section arrest of decent. Eating a regular diet without difficulty. Bowel movements are  abnormal, she still has some mild constipation . Pain is not well controlled.  Medications being used: ibuprofen (OTC) and narcotic analgesics including tramadol (Ultram).  The following portions of the patient's history were reviewed and updated as appropriate: allergies, current medications, past family history, past medical history, past social history, past surgical history, and problem list.  Review of Systems Pertinent items noted in HPI and remainder of comprehensive ROS otherwise negative.   Objective:   LMP 10/22/2021 (Approximate)  Body mass index is 45.95 kg/m. Blood pressure 126/68, pulse 83, resp. rate 16, height '5\' 2"'$  (1.575 m), weight 251 lb 3.2 oz (113.9 kg), last menstrual period 10/22/2021, unknown if currently breastfeeding.    General:  alert and no distress  Abdomen: soft, bowel sounds active, non-tender  Incision:   healing well, no drainage, no erythema, no hernia, no seroma, no swelling, no dehiscence, incision well approximated    Pathology:    Assessment:   Patient s/p Low-Transverse C-Section (surgery)  Doing well postoperatively.   Plan:   1. Continue any current medications as instructed by provider. 2. Wound care discussed. 3. Operative findings again reviewed. Pathology report discussed. 4. Activity restrictions: {restrictions:13723} 5. Anticipated return to work: {work return:14002}. 6. Follow up: 4 weeks for 6 week postpartum check     Rubie Maid, MD [place]

## 2022-09-20 ENCOUNTER — Encounter: Payer: Self-pay | Admitting: Obstetrics and Gynecology

## 2022-09-20 ENCOUNTER — Ambulatory Visit (INDEPENDENT_AMBULATORY_CARE_PROVIDER_SITE_OTHER): Payer: Medicaid Other | Admitting: Obstetrics and Gynecology

## 2022-09-20 VITALS — BP 126/68 | HR 83 | Resp 16 | Ht 62.0 in | Wt 251.2 lb

## 2022-09-20 DIAGNOSIS — O165 Unspecified maternal hypertension, complicating the puerperium: Secondary | ICD-10-CM

## 2022-09-20 DIAGNOSIS — Z4889 Encounter for other specified surgical aftercare: Secondary | ICD-10-CM

## 2022-10-21 NOTE — Progress Notes (Signed)
OBSTETRICS POSTPARTUM CLINIC PROGRESS NOTE  Subjective:     Jill Kelly is a 32 y.o. G20P1011 female who presents for a postpartum visit. She is {1-10:13787} {time; units:18646} postpartum following a low cervical transverse Cesarean section. I have fully reviewed the prenatal and intrapartum course. The delivery was at [redacted]w[redacted]d gestational weeks.  Anesthesia: spinal. Postpartum course has been ***. Baby's course has been ***. Baby is feeding by {breast/bottle:69}. Bleeding: patient {HAS HAS NCG:8705835not resumed menses, with No LMP recorded.. Bowel function is {normal:32111}. Bladder function is {normal:32111}. Patient {is/is not:9024} sexually active. Contraception method desired is {contraceptive method:5051}. Postpartum depression screening: {neg default:13464::"negative"}.  EDPS score is ***.    The following portions of the patient's history were reviewed and updated as appropriate: allergies, current medications, past family history, past medical history, past social history, past surgical history, and problem list.  Review of Systems {ros; complete:30496}   Objective:    There were no vitals taken for this visit.  General:  alert and no distress   Breasts:  inspection negative, no nipple discharge or bleeding, no masses or nodularity palpable  Lungs: clear to auscultation bilaterally  Heart:  regular rate and rhythm, S1, S2 normal, no murmur, click, rub or gallop  Abdomen: soft, non-tender; bowel sounds normal; no masses,  no organomegaly.  ***Well healed Pfannenstiel incision   Vulva:  normal  Vagina: normal vagina, no discharge, exudate, lesion, or erythema  Cervix:  no cervical motion tenderness and no lesions  Corpus: normal size, contour, position, consistency, mobility, non-tender  Adnexa:  normal adnexa and no mass, fullness, tenderness  Rectal Exam: Not performed.         Labs:  Lab Results  Component Value Date   HGB 9.1 (L) 09/09/2022     Assessment:    No diagnosis found.   Plan:    1. Contraception: {method:5051} 2. Will check Hgb for h/o postpartum anemia of less than 10.  3. Follow up in: {1-10:13787} {time; units:19136} or as needed.    GLandis Gandy CMA [facility]  OBSTETRICS POSTPARTUM CLINIC PROGRESS NOTE  Subjective:     Jill STakia Montelis a 32y.o. GG68P1011female who presents for a postpartum visit. She is {1-10:13787} {time; units:18646} postpartum following a {delivery:12449}. I have fully reviewed the prenatal and intrapartum course. The delivery was at *** gestational weeks.  Anesthesia: {anesthesia types:812}. Postpartum course has been ***. Baby's course has been ***. Baby is feeding by {breast/bottle:69}. Bleeding: patient {HAS HAS NCG:8705835not resumed menses, with No LMP recorded.. Bowel function is {normal:32111}. Bladder function is {normal:32111}. Patient {is/is not:9024} sexually active. Contraception method desired is {contraceptive method:5051}. Postpartum depression screening: {neg default:13464::"negative"}.  EDPS score is ***.    The following portions of the patient's history were reviewed and updated as appropriate: allergies, current medications, past family history, past medical history, past social history, past surgical history, and problem list.  Review of Systems {ros; complete:30496}   Objective:    There were no vitals taken for this visit.  General:  alert and no distress   Breasts:  inspection negative, no nipple discharge or bleeding, no masses or nodularity palpable  Lungs: clear to auscultation bilaterally  Heart:  regular rate and rhythm, S1, S2 normal, no murmur, click, rub or gallop  Abdomen: soft, non-tender; bowel sounds normal; no masses,  no organomegaly.  ***Well healed Pfannenstiel incision   Vulva:  normal  Vagina: normal vagina, no discharge, exudate, lesion, or erythema  Cervix:  no cervical motion tenderness and no lesions  Corpus: normal size, contour,  position, consistency, mobility, non-tender  Adnexa:  normal adnexa and no mass, fullness, tenderness  Rectal Exam: Not performed.         Labs:  Lab Results  Component Value Date   HGB 9.1 (L) 09/09/2022     Assessment:   No diagnosis found.   Plan:    1. Contraception: {method:5051} 2. Will check Hgb for h/o postpartum anemia of less than 10.  3. Follow up in: {1-10:13787} {time; units:19136} or as needed.    Landis Gandy, [facility]

## 2022-10-22 ENCOUNTER — Ambulatory Visit (INDEPENDENT_AMBULATORY_CARE_PROVIDER_SITE_OTHER): Payer: Medicaid Other | Admitting: Obstetrics and Gynecology

## 2022-10-22 ENCOUNTER — Encounter: Payer: Self-pay | Admitting: Obstetrics and Gynecology

## 2022-10-22 DIAGNOSIS — O9081 Anemia of the puerperium: Secondary | ICD-10-CM

## 2022-10-22 DIAGNOSIS — O165 Unspecified maternal hypertension, complicating the puerperium: Secondary | ICD-10-CM | POA: Insufficient documentation

## 2022-10-22 DIAGNOSIS — F419 Anxiety disorder, unspecified: Secondary | ICD-10-CM | POA: Insufficient documentation

## 2022-10-22 HISTORY — DX: Unspecified maternal hypertension, complicating the puerperium: O16.5

## 2022-10-22 NOTE — Patient Instructions (Signed)

## 2022-11-08 ENCOUNTER — Telehealth: Payer: Self-pay

## 2022-11-08 NOTE — Telephone Encounter (Signed)
[facility]- Discharge Call Backs-Spoke to pt on the phone about the following below 1-Do you have any questions or concerns about yourself as you heal?No 2-Any concerns or questions about your baby?No-Baby just had a f/u with [facility].  3-Review ABC's of safe sleep. 4-How was your stay at the hospital?Wonderful 5-How did our team work together to care for you?Everyone was great. You should be receiving a survey in the mail soon.   We would really appreciate it if you could fill that out for [place] and return it in the mail.  We value the feedback to make improvements and continue the great work we do.   If you have any questions please feel free to call me back at [phone]

## 2022-12-17 ENCOUNTER — Ambulatory Visit: Payer: Medicaid Other | Admitting: Obstetrics and Gynecology

## 2022-12-18 ENCOUNTER — Ambulatory Visit: Payer: Medicaid Other | Admitting: Obstetrics and Gynecology

## 2022-12-18 DIAGNOSIS — D219 Benign neoplasm of connective and other soft tissue, unspecified: Secondary | ICD-10-CM

## 2023-09-09 ENCOUNTER — Ambulatory Visit (LOCAL_COMMUNITY_HEALTH_CENTER): Payer: Self-pay

## 2023-09-09 DIAGNOSIS — Z111 Encounter for screening for respiratory tuberculosis: Secondary | ICD-10-CM

## 2023-09-12 ENCOUNTER — Other Ambulatory Visit: Payer: Medicaid Other

## 2023-10-03 ENCOUNTER — Ambulatory Visit (LOCAL_COMMUNITY_HEALTH_CENTER): Payer: Self-pay

## 2023-10-03 DIAGNOSIS — Z111 Encounter for screening for respiratory tuberculosis: Secondary | ICD-10-CM

## 2023-10-06 ENCOUNTER — Ambulatory Visit (LOCAL_COMMUNITY_HEALTH_CENTER): Payer: Medicaid Other

## 2023-10-06 DIAGNOSIS — Z111 Encounter for screening for respiratory tuberculosis: Secondary | ICD-10-CM

## 2023-10-06 LAB — TB SKIN TEST
Induration: 0 mm
TB Skin Test: NEGATIVE

## 2023-10-21 ENCOUNTER — Ambulatory Visit: Payer: Self-pay | Admitting: Obstetrics and Gynecology
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
## 2023-10-23 ENCOUNTER — Ambulatory Visit: Admitting: Family Medicine

## 2023-10-27 ENCOUNTER — Ambulatory Visit
Admission: EM | Admit: 2023-10-27 | Discharge: 2023-10-27 | Disposition: A | Discharge status: Home / Self Care | Attending: Physician Assistant | Admitting: Physician Assistant

## 2023-10-27 DIAGNOSIS — M25512 Pain in left shoulder: Secondary | ICD-10-CM

## 2023-10-27 DIAGNOSIS — S8011XA Contusion of right lower leg, initial encounter: Secondary | ICD-10-CM

## 2023-10-27 DIAGNOSIS — S7012XA Contusion of left thigh, initial encounter: Secondary | ICD-10-CM | POA: Diagnosis not present

## 2023-10-27 DIAGNOSIS — M25511 Pain in right shoulder: Secondary | ICD-10-CM

## 2023-10-27 NOTE — Discharge Instructions (Signed)
-  Continue applying ice and elevating extremity.  Continue ibuprofen and/or Tylenol.  Use compression wrap. - Contact PT and Ortho below.  Follow up with Results PT  [address] [place], [place] [address] Phone: [phone]  You have a condition requiring you to follow up with Orthopedics so please call one of the following office for appointment:   [facility] Address: [address], [place], [place] [address] Phone: [phone]  [facility] [address] [place], [place] [address] Phone: [phone]  [facility] [address], [place], [place] [address] Phone: [phone]
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
## 2023-11-27 ENCOUNTER — Ambulatory Visit: Payer: Self-pay | Admitting: Internal Medicine

## 2023-11-27 ENCOUNTER — Other Ambulatory Visit: Payer: Self-pay

## 2023-11-27 ENCOUNTER — Encounter: Payer: Self-pay | Admitting: Internal Medicine

## 2023-11-27 VITALS — BP 126/82 | HR 85 | Temp 98.0°F | Resp 16 | Ht 62.0 in | Wt 274.9 lb

## 2023-11-27 DIAGNOSIS — F419 Anxiety disorder, unspecified: Secondary | ICD-10-CM | POA: Diagnosis not present

## 2023-11-27 DIAGNOSIS — Z13220 Encounter for screening for lipoid disorders: Secondary | ICD-10-CM

## 2023-11-27 DIAGNOSIS — Z6841 Body Mass Index (BMI) 40.0 and over, adult: Secondary | ICD-10-CM

## 2023-11-27 DIAGNOSIS — R635 Abnormal weight gain: Secondary | ICD-10-CM

## 2023-11-27 MED ORDER — HYDROXYZINE HCL 10 MG PO TABS: 10.0000 mg | ORAL_TABLET | Freq: Every evening | ORAL | 1 refills | Status: DC | PRN

## 2023-11-27 MED ORDER — ESCITALOPRAM OXALATE 5 MG PO TABS: 5.0000 mg | ORAL_TABLET | Freq: Every day | ORAL | 1 refills | Status: DC

## 2023-11-27 NOTE — Assessment & Plan Note (Signed)
 Postpartum depression and anxiety. Discussed in depth, placed referral for therapy and will start low dose Lexapro and Hydroxyzine PRN. Will follow up in 6 weeks to recheck.

## 2023-11-27 NOTE — Assessment & Plan Note (Signed)
 Discussed weight loss medications today, specifically GLP-1's, how they work and potential side effects. No personal or family history of pancreatitis or medullary thyroid carcinoma. Will check A1c, lipids and TSH today and she will call her insurance to find out about coverage options.

## 2023-11-27 NOTE — Patient Instructions (Addendum)
 It was great seeing you today!  Plan discussed at today's visit: -Blood work ordered today, results will be uploaded to MyChart.  -Please call insurance and ask about coverage for weight loss medications without diabetes which is Wegovy or Zepbound -Start Lexapro 5 mg daily for anxiety/depression, will take 4-6 weeks to start working  -Can take Hydroxyzine as needed for anxiety but can cause sleepiness, do not take and drive -Referral placed for counseling, they will call you to set up an appointment in about 1-2 weeks   Follow up in: 6 weeks   Take care and let us know if you have any questions or concerns prior to your next visit.  Dr. Caralee Ates
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
## 2023-11-28 LAB — HEMOGLOBIN A1C
Hgb A1c MFr Bld: 5.4 %{Hb} (ref <5.7)
Mean Plasma Glucose: 108 mg/dL
eAG (mmol/L): 6 mmol/L

## 2023-11-28 LAB — LIPID PANEL
Cholesterol: 198 mg/dL (ref <200)
HDL: 38 mg/dL — ABNORMAL LOW (ref >=50)
LDL Cholesterol (Calc): 141 mg/dL — ABNORMAL HIGH
Non-HDL Cholesterol (Calc): 160 mg/dL — ABNORMAL HIGH (ref <130)
Total CHOL/HDL Ratio: 5.2 (calc) — ABNORMAL HIGH (ref <5.0)
Triglycerides: 84 mg/dL (ref <150)

## 2023-11-28 LAB — TSH: TSH: 0.44 m[IU]/L

## 2023-12-18 ENCOUNTER — Telehealth: Payer: Self-pay | Admitting: Internal Medicine

## 2023-12-18 ENCOUNTER — Other Ambulatory Visit: Payer: Self-pay | Admitting: Internal Medicine

## 2023-12-18 DIAGNOSIS — E782 Mixed hyperlipidemia: Secondary | ICD-10-CM

## 2023-12-18 MED ORDER — TIRZEPATIDE-WEIGHT MANAGEMENT 2.5 MG/0.5ML ~~LOC~~ SOLN: 2.5000 mg | SUBCUTANEOUS | 0 refills | Status: DC

## 2023-12-18 NOTE — Telephone Encounter (Signed)
 PA started through cover my meds

## 2023-12-18 NOTE — Telephone Encounter (Signed)
 Copied from CRM [id number]. Topic: Clinical - Medication Question >> Dec 18, 2023  8:28 AM Hassie Lint wrote: Reason for CRM: Patient was seen in office on 11/27/23 and spoke with the dr in regards to weight loss medication. States her previous insurance did not cover but has just changed to new insurance that does cover weight loss medication. Patient is asking if Dr can send prescription to her preferred pharmacy.  Preferred Pharmacy: [id number],  - [address] AT [facility] OF [address]  Phone: [phone] Fax: [phone]  Patient can be reached at [phone]

## 2023-12-18 NOTE — Telephone Encounter (Signed)
 Pt.notified

## 2023-12-18 NOTE — Telephone Encounter (Signed)
 tirzepatide (ZEPBOUND) 2.5 MG/0.5ML injection vial   Pt ID: [id number]

## 2023-12-19 ENCOUNTER — Other Ambulatory Visit: Payer: Self-pay | Admitting: Internal Medicine

## 2023-12-19 DIAGNOSIS — E782 Mixed hyperlipidemia: Secondary | ICD-10-CM

## 2023-12-19 MED ORDER — WEGOVY 0.25 MG/0.5ML ~~LOC~~ SOAJ: 0.2500 mg | SUBCUTANEOUS | 0 refills | Status: DC

## 2023-12-22 ENCOUNTER — Other Ambulatory Visit (HOSPITAL_COMMUNITY): Payer: Self-pay

## 2023-12-22 ENCOUNTER — Telehealth: Payer: Self-pay | Admitting: Pharmacy Technician

## 2023-12-22 NOTE — Telephone Encounter (Signed)
 Pharmacy Patient Advocate Encounter   Received notification from Onbase that prior authorization for ZEPBOUND 2.5 MG is required/requested.   Insurance verification completed.   The patient is insured through [employer] .   Per test claim:  WEGOVY 2.5 MG is preferred by the insurance.  If suggested medication is appropriate, Please send in a new RX and discontinue this one. If not, please advise as to why it's not appropriate so that we may request a Prior Authorization. Please note, some preferred medications may still require a PA.  If the suggested medications have not been trialed and there are no contraindications to their use, the PA will not be submitted, as it will not be approved.

## 2023-12-29 ENCOUNTER — Telehealth: Payer: Self-pay

## 2023-12-29 ENCOUNTER — Other Ambulatory Visit (HOSPITAL_COMMUNITY): Payer: Self-pay

## 2023-12-29 NOTE — Telephone Encounter (Unsigned)
 Copied from CRM [id number]. Topic: Clinical - Prescription Issue >> Dec 29, 2023  9:29 AM Dorthula Gavel H wrote: Reason for CRM: Pt states she is still having issues with getting the prescription for Semaglutide -Weight Management (WEGOVY ) 0.25 MG/0.5ML SOAJ, pt would like a call back as she states she is getting the run around.

## 2023-12-30 ENCOUNTER — Other Ambulatory Visit (HOSPITAL_COMMUNITY): Payer: Self-pay

## 2023-12-30 ENCOUNTER — Other Ambulatory Visit: Payer: Self-pay | Admitting: Internal Medicine

## 2023-12-30 DIAGNOSIS — E782 Mixed hyperlipidemia: Secondary | ICD-10-CM

## 2023-12-30 MED ORDER — WEGOVY 0.25 MG/0.5ML ~~LOC~~ SOAJ: 0.2500 mg | SUBCUTANEOUS | 0 refills | Status: DC

## 2023-12-30 NOTE — Telephone Encounter (Signed)
 Good morning, not sure why she can't get her Wegovy  but it is definitely covered under her plan see below for results of my test claim and her copay is $4.00.

## 2023-12-30 NOTE — Telephone Encounter (Signed)
 Pt states there has been mix up at pharmacy,  hopefully will get today

## 2024-01-08 ENCOUNTER — Other Ambulatory Visit: Payer: Self-pay

## 2024-01-08 ENCOUNTER — Ambulatory Visit (INDEPENDENT_AMBULATORY_CARE_PROVIDER_SITE_OTHER): Admitting: Internal Medicine

## 2024-01-08 ENCOUNTER — Other Ambulatory Visit (HOSPITAL_COMMUNITY): Payer: Self-pay

## 2024-01-08 ENCOUNTER — Encounter: Payer: Self-pay | Admitting: Internal Medicine

## 2024-01-08 ENCOUNTER — Telehealth: Payer: Self-pay | Admitting: Pharmacy Technician

## 2024-01-08 VITALS — BP 122/80 | HR 98 | Resp 16 | Ht 62.0 in | Wt 273.1 lb

## 2024-01-08 DIAGNOSIS — F419 Anxiety disorder, unspecified: Secondary | ICD-10-CM

## 2024-01-08 DIAGNOSIS — Z6841 Body Mass Index (BMI) 40.0 and over, adult: Secondary | ICD-10-CM | POA: Diagnosis not present

## 2024-01-08 DIAGNOSIS — E782 Mixed hyperlipidemia: Secondary | ICD-10-CM | POA: Diagnosis not present

## 2024-01-08 MED ORDER — HYDROXYZINE HCL 10 MG PO TABS: 10.0000 mg | ORAL_TABLET | Freq: Every evening | ORAL | 1 refills | Status: DC | PRN

## 2024-01-08 MED ORDER — ESCITALOPRAM OXALATE 10 MG PO TABS: 10.0000 mg | ORAL_TABLET | Freq: Every day | ORAL | 1 refills | Status: DC

## 2024-01-08 MED ORDER — WEGOVY 0.5 MG/0.5ML ~~LOC~~ SOAJ: 0.5000 mg | SUBCUTANEOUS | 0 refills | Status: DC

## 2024-01-08 NOTE — Progress Notes (Signed)
 Established Patient Office Visit  Subjective    Patient ID: Jill Kelly, female    DOB: 1990/12/20  Age: 33 y.o. MRN: [id number]  CC:  Chief Complaint  Patient presents with   Follow-up    6 week recheck    HPI Jill Kelly presents to follow up on weight loss medication and anxiety medication.   Discussed the use of AI scribe software for clinical note transcription with the patient, who gave verbal consent to proceed.  History of Present Illness Jill Kelly is a 33 year old female who presents for follow-up on weight loss medication and Lexapro .  She started Wegovy  0.25 mg recently and has taken two doses without experiencing side effects such as nausea or heartburn. She has not observed any weight loss yet and is on a 0.25 mg initiation dose. She feels preoccupied with thoughts of food.  She is on Lexapro  at a low dose of 5 mg for anxiety and depression. She tolerates it well but does not notice significant changes in her symptoms. Hydroxyzine  working well for sleep.   MDD/Anxiety: -Postpartum  -Mood status: uncontrolled -Current treatment: started on Lexapro  5 mg at LOV -Symptom severity: moderate  Psychotherapy/counseling: no  Previous psychiatric medications: None     11/27/2023   10:25 AM 01/17/2022    3:57 PM  Depression screen PHQ 2/9  Decreased Interest 2 0  Down, Depressed, Hopeless 2 1  PHQ - 2 Score 4 1  Altered sleeping 1   Tired, decreased energy 1   Change in appetite 1   Feeling bad or failure about yourself  0   Trouble concentrating 1   Moving slowly or fidgety/restless 1   Suicidal thoughts 0   PHQ-9 Score 9   Difficult doing work/chores Somewhat difficult    Health Maintenance: -Blood work UTD -Pap 7/23 negative   Outpatient Encounter Medications as of 01/08/2024  Medication Sig   escitalopram  (LEXAPRO ) 5 MG tablet Take 1 tablet (5 mg total) by mouth daily.   hydrOXYzine  (ATARAX ) 10 MG tablet Take 1 tablet (10  mg total) by mouth at bedtime as needed for anxiety.   Semaglutide -Weight Management (WEGOVY ) 0.25 MG/0.5ML SOAJ Inject 0.25 mg into the skin once a week.   No facility-administered encounter medications on file as of 01/08/2024.    Past Medical History:  Diagnosis Date   Fibroids    uterine fibroids- dx 09/2021   Medical history non-contributory    Postpartum hypertension 10/22/2022    Past Surgical History:  Procedure Laterality Date   CESAREAN SECTION  09/04/2022   Procedure: CESAREAN SECTION;  Surgeon: Teresa Fender, MD;  Location: [facility] ORS;  Service: Obstetrics;;   NO PAST SURGERIES      Family History  Problem Relation Age of Onset   Hypertension Mother    Hypertension Father     Social History   Socioeconomic History   Marital status: Single    Spouse name: Not on file   Number of children: 1   Years of education: 23   Highest education level: Not on file  Occupational History   Occupation: braid hair  Tobacco Use   Smoking status: Every Day    Current packs/day: 0.25    Types: Cigarettes   Smokeless tobacco: Never  Vaping Use   Vaping status: Never Used  Substance and Sexual Activity   Alcohol use: Not Currently    Comment: last use 12/2021   Drug use: No   Sexual activity: Yes  Birth control/protection: None  Other Topics Concern   Not on file  Social History Narrative   Not on file   Social Drivers of Health   Financial Resource Strain: Medium Risk (02/05/2022)   Overall Financial Resource Strain (CARDIA)    Difficulty of Paying Living Expenses: Somewhat hard  Food Insecurity: No Food Insecurity (09/04/2022)   Hunger Vital Sign    Worried About Running Out of Food in the Last Year: Never true    Ran Out of Food in the Last Year: Never true  Transportation Needs: No Transportation Needs (09/04/2022)   PRAPARE - Administrator, Civil Service (Medical): No    Lack of Transportation (Non-Medical): No  Physical Activity: Inactive  (02/05/2022)   Exercise Vital Sign    Days of Exercise per Week: 0 days    Minutes of Exercise per Session: 0 min  Stress: Stress Concern Present (02/05/2022)   [employer] of Occupational Health - Occupational Stress Questionnaire    Feeling of Stress : To some extent  Social Connections: Unknown (02/05/2022)   Social Connection and Isolation Panel [NHANES]    Frequency of Communication with Friends and Family: More than three times a week    Frequency of Social Gatherings with Friends and Family: More than three times a week    Attends Religious Services: Never    Database administrator or Organizations: No    Attends Banker Meetings: Never    Marital Status: Not on file  Intimate Partner Violence: Not At Risk (09/04/2022)   Humiliation, Afraid, Rape, and Kick questionnaire    Fear of Current or Ex-Partner: No    Emotionally Abused: No    Physically Abused: No    Sexually Abused: No    Review of Systems  Gastrointestinal:  Negative for constipation, heartburn, nausea and vomiting.  Psychiatric/Behavioral:  Positive for depression. The patient is nervous/anxious. The patient does not have insomnia.         Objective    BP 122/80 (Cuff Size: Large)   Pulse 98   Resp 16   Ht 5\' 2"  (1.575 m)   Wt 273 lb 1.6 oz (123.9 kg)   SpO2 98%   BMI 49.95 kg/m   Physical Exam Constitutional:      Appearance: Normal appearance.  HENT:     Head: Normocephalic and atraumatic.  Eyes:     Conjunctiva/sclera: Conjunctivae normal.  Cardiovascular:     Rate and Rhythm: Normal rate and regular rhythm.  Pulmonary:     Effort: Pulmonary effort is normal.     Breath sounds: Normal breath sounds.  Skin:    General: Skin is warm and dry.  Neurological:     General: No focal deficit present.     Mental Status: She is alert. Mental status is at baseline.  Psychiatric:        Mood and Affect: Mood normal.        Behavior: Behavior normal.     Last CBC Lab Results   Component Value Date   WBC 7.4 09/09/2022   HGB 9.1 (L) 09/09/2022   HCT 27.9 (L) 09/09/2022   MCV 91.8 09/09/2022   MCH 29.9 09/09/2022   RDW 13.2 09/09/2022   PLT 308 09/09/2022   Last metabolic panel Lab Results  Component Value Date   GLUCOSE 77 09/09/2022   NA 138 09/09/2022   K 3.5 09/09/2022   CL 105 09/09/2022   CO2 26 09/09/2022   BUN 12 09/09/2022  CREATININE 0.67 09/09/2022   GFRNONAA >60 09/09/2022   CALCIUM 8.1 (L) 09/09/2022   PROT 6.3 (L) 09/09/2022   ALBUMIN 2.7 (L) 09/09/2022   BILITOT 0.1 (L) 09/09/2022   ALKPHOS 109 09/09/2022   AST 40 09/09/2022   ALT 34 09/09/2022   ANIONGAP 7 09/09/2022   Last lipids Lab Results  Component Value Date   CHOL 198 11/27/2023   HDL 38 (L) 11/27/2023   LDLCALC 141 (H) 11/27/2023   TRIG 84 11/27/2023   CHOLHDL 5.2 (H) 11/27/2023   Last hemoglobin A1c Lab Results  Component Value Date   HGBA1C 5.4 11/27/2023   Last thyroid functions Lab Results  Component Value Date   TSH 0.44 11/27/2023   Last vitamin D No results found for: "25OHVITD2", "25OHVITD3", "VD25OH" Last vitamin B12 and Folate No results found for: "VITAMINB12", "FOLATE"      Assessment & Plan:   Assessment & Plan Depression and Anxiety Lexapro  5 mg well-tolerated but insufficient. Hydroxyzine  effective for sleep and anxiety. - Increase Lexapro  to 10 mg daily. - Send Lexapro  prescription to [facility] on [address]. - Continue hydroxyzine  for sleep and anxiety management. - Schedule follow-up in 6 weeks to assess response.  Hyperlipidemia/Obesity Cholesterol slightly elevated; no immediate medication needed. Lifestyle changes recommended. - Advise reducing intake of red meats and fatty processed foods. - Encourage regular exercise to increase HDL levels. - Recheck cholesterol levels in one year. - Increase Wegovy  to 0.5 mg dose, recheck in 6 weeks.  - Semaglutide -Weight Management (WEGOVY ) 0.5 MG/0.5ML SOAJ; Inject 0.5 mg into  the skin once a week.  Dispense: 2 mL; Refill: 0 - escitalopram  (LEXAPRO ) 10 MG tablet; Take 1 tablet (10 mg total) by mouth daily.  Dispense: 30 tablet; Refill: 1 - hydrOXYzine  (ATARAX ) 10 MG tablet; Take 1 tablet (10 mg total) by mouth at bedtime as needed for anxiety.  Dispense: 90 tablet; Refill: 1    Return in about 6 weeks (around 02/19/2024).   Rockney Cid, DO

## 2024-01-08 NOTE — Telephone Encounter (Signed)
 Pharmacy Patient Advocate Encounter   Received notification from Onbase that prior authorization for Wegovy  0.5MG /0.5ML auto-injectors is required/requested.   Insurance verification completed.   The patient is insured through [employer] .   Per test claim: Refill too soon. PA is not needed at this time. Medication was filled 12/30/23. Next eligible fill date is once 75% of medication is used.   This is just FYI only

## 2024-02-04 ENCOUNTER — Telehealth: Payer: Self-pay | Admitting: *Deleted

## 2024-02-04 DIAGNOSIS — F17200 Nicotine dependence, unspecified, uncomplicated: Secondary | ICD-10-CM

## 2024-02-04 NOTE — Progress Notes (Signed)
 Complex Care Management Note Care Guide Note  02/04/2024 Name: Jill Kelly MRN: [id number] DOB: 1991-03-24   Complex Care Management Outreach Attempts: An unsuccessful telephone outreach was attempted today to offer the patient information about available complex care management services.  Follow Up Plan:  Additional outreach attempts will be made to offer the patient complex care management information and services.   Encounter Outcome:  No Answer  Kandis Ormond, CMA [place]  [facility], [facility] Guide Direct Dial: [phone]  Fax: [phone] Website: [email or web address]

## 2024-02-04 NOTE — Progress Notes (Signed)
 Complex Care Management Note  Care Guide Note 02/04/2024 Name: Jill Kelly MRN: [id number] DOB: 04-08-1991  Jill Kelly is a 33 y.o. year old female who sees Rockney Cid, DO for primary care. I reached out to Alver Jobs by phone today to offer complex care management services.  [name] was given information about Complex Care Management services today including:   The Complex Care Management services include support from the care team which includes your Nurse Care Manager, Clinical Social Worker, or Pharmacist.  The Complex Care Management team is here to help remove barriers to the health concerns and goals most important to you. Complex Care Management services are voluntary, and the patient may decline or stop services at any time by request to their care team member.   Complex Care Management Consent Status: Patient agreed to services and verbal consent obtained.   Follow up plan:  Telephone appointment with complex care management team member scheduled for:  03/16/2024  Encounter Outcome:  Patient Scheduled  Kandis Ormond, CMA, [place]  [facility], [facility] Guide Direct Dial: [phone]  Fax: [phone] Website: [email or web address]

## 2024-02-23 ENCOUNTER — Ambulatory Visit: Admitting: Internal Medicine

## 2024-02-24 ENCOUNTER — Other Ambulatory Visit: Payer: Self-pay | Admitting: Internal Medicine

## 2024-02-24 NOTE — Telephone Encounter (Unsigned)
 Copied from CRM [id number]. Topic: Clinical - Medication Refill >> Feb 24, 2024 12:52 PM Turkey B wrote: Medication: Semaglutide -Weight Management (WEGOVY ) 0.5 MG/0.5ML   Has the patient contacted their pharmacy? yes (Agent: If yes, when and what did the pharmacy advise?)contact pcp  This is the patient's preferred pharmacy:  [facility] [address] ([address]), [place] - [address] [place]) [place] [address] Phone: [phone] Fax: [phone]  Is this the correct pharmacy for this prescription? yes    Has the prescription been filled recently? no  Is the patient out of the medication? yes  Has the patient been seen for an appointment in the last year OR does the patient have an upcoming appointment? yes  Can we respond through MyChart? yes  Agent: Please be advised that Rx refills may take up to 3 business days. We ask that you follow-up with your pharmacy.

## 2024-02-26 MED ORDER — WEGOVY 0.5 MG/0.5ML ~~LOC~~ SOAJ: 0.5000 mg | SUBCUTANEOUS | 0 refills | Status: DC

## 2024-02-26 NOTE — Telephone Encounter (Signed)
 Requested Prescriptions  Pending Prescriptions Disp Refills   Semaglutide -Weight Management (WEGOVY ) 0.5 MG/0.5ML SOAJ 2 mL 0    Sig: Inject 0.5 mg into the skin once a week.     Endocrinology:  Diabetes - GLP-1 Receptor Agonists - semaglutide  Failed - 02/26/2024  2:18 PM      Failed - Cr in normal range and within 360 days    Creatinine  Date Value Ref Range Status  06/01/2014 0.95 0.60 - 1.30 mg/dL Final   Creatinine, Ser  Date Value Ref Range Status  09/09/2022 0.67 0.44 - 1.00 mg/dL Final         Passed - HBA1C in normal range and within 180 days    Hgb A1c MFr Bld  Date Value Ref Range Status  11/27/2023 5.4 <5.7 % of total Hgb Final    Comment:    For the purpose of screening for the presence of diabetes: . <5.7%       Consistent with the absence of diabetes 5.7-6.4%    Consistent with increased risk for diabetes             (prediabetes) > or =6.5%  Consistent with diabetes . This assay result is consistent with a decreased risk of diabetes. . Currently, no consensus exists regarding use of hemoglobin A1c for diagnosis of diabetes in children. . According to American Diabetes Association (ADA) guidelines, hemoglobin A1c <7.0% represents optimal control in non-pregnant diabetic patients. Different metrics may apply to specific patient populations.  Standards of Medical Care in Diabetes(ADA). SABRA Amy - Valid encounter within last 6 months    Recent Outpatient Visits           1 month ago Morbid obesity with BMI of 45.0-49.9, adult [facility])   [facility] Bernardo Fend, DO   3 months ago Anxiety   [facility] Bernardo Fend, [place]

## 2024-02-27 ENCOUNTER — Other Ambulatory Visit: Payer: Self-pay

## 2024-02-27 ENCOUNTER — Encounter: Payer: Self-pay | Admitting: Internal Medicine

## 2024-02-27 ENCOUNTER — Ambulatory Visit: Admitting: Internal Medicine

## 2024-02-27 VITALS — BP 122/74 | HR 97 | Temp 97.9°F | Resp 16 | Ht 62.0 in | Wt 268.4 lb

## 2024-02-27 DIAGNOSIS — F419 Anxiety disorder, unspecified: Secondary | ICD-10-CM

## 2024-02-27 DIAGNOSIS — Z6841 Body Mass Index (BMI) 40.0 and over, adult: Secondary | ICD-10-CM

## 2024-02-27 DIAGNOSIS — D219 Benign neoplasm of connective and other soft tissue, unspecified: Secondary | ICD-10-CM | POA: Diagnosis not present

## 2024-02-27 MED ORDER — ESCITALOPRAM OXALATE 10 MG PO TABS: 10.0000 mg | ORAL_TABLET | Freq: Every day | ORAL | 1 refills | Status: AC

## 2024-02-27 MED ORDER — NAPROXEN 500 MG PO TABS: 500.0000 mg | ORAL_TABLET | Freq: Two times a day (BID) | ORAL | 1 refills | Status: AC | PRN

## 2024-02-27 MED ORDER — HYDROXYZINE HCL 10 MG PO TABS: 10.0000 mg | ORAL_TABLET | Freq: Every evening | ORAL | 1 refills | Status: AC | PRN

## 2024-02-27 MED ORDER — WEGOVY 0.5 MG/0.5ML ~~LOC~~ SOAJ: 0.5000 mg | SUBCUTANEOUS | 2 refills | Status: AC

## 2024-02-27 NOTE — Progress Notes (Signed)
 Established Patient Office Visit  Subjective    Patient ID: Jill Kelly, female    DOB: May 01, 1991  Age: 33 y.o. MRN: [id number]  CC:  Chief Complaint  Patient presents with   Follow-up    6 week recheck    HPI Jill Kelly presents to follow up on weight loss medication and anxiety medication.   Discussed the use of AI scribe software for clinical note transcription with the patient, who gave verbal consent to proceed.  History of Present Illness Jill Kelly is a 33 year old female who presents for follow-up on Wegovy  treatment for weight loss.  She has been on Wegovy  for three months, currently on a 0.5 mg dose for the past two months, resulting in a total weight loss of six pounds, with five pounds lost on the current dose. She tolerates the medication well, except for constipation, which she attributes to the medication. She is not actively weighing herself at home.  She drinks Premier protein drinks, consuming one per day, typically breaking her fast with it as part of her intermittent fasting routine.  She is also on Lexapro  for depression and anxiety, and hydroxyzine  for sleep. She takes two hydroxyzine  at bedtime and feels okay with the current Lexapro  dose. She has not experienced any issues with these medications.  She has severe menstrual pain, requiring Tylenol  and dual-action pain relief around the clock. Her last pregnancy was 18 months ago.  She is currently using [facility] for her prescriptions and has recently picked up her Wegovy  prescription.   MDD/Anxiety: -Postpartum  -Mood status: better -Current treatment: Lexapro  increased to 10 mg at LOV, also has Hydroxyzine  PRN (taking at bedtime to help sleep) -Symptom severity: moderate  Psychotherapy/counseling: no  Previous psychiatric medications: None     11/27/2023   10:25 AM 01/17/2022    3:57 PM  Depression screen PHQ 2/9  Decreased Interest 2 0  Down, Depressed, Hopeless 2 1   PHQ - 2 Score 4 1  Altered sleeping 1   Tired, decreased energy 1   Change in appetite 1   Feeling bad or failure about yourself  0   Trouble concentrating 1   Moving slowly or fidgety/restless 1   Suicidal thoughts 0   PHQ-9 Score 9   Difficult doing work/chores Somewhat difficult    Health Maintenance: -Blood work UTD -Pap 7/23 negative   Outpatient Encounter Medications as of 02/27/2024  Medication Sig   escitalopram  (LEXAPRO ) 10 MG tablet Take 1 tablet (10 mg total) by mouth daily.   hydrOXYzine  (ATARAX ) 10 MG tablet Take 1 tablet (10 mg total) by mouth at bedtime as needed for anxiety.   Semaglutide -Weight Management (WEGOVY ) 0.5 MG/0.5ML SOAJ Inject 0.5 mg into the skin once a week.   No facility-administered encounter medications on file as of 02/27/2024.    Past Medical History:  Diagnosis Date   Fibroids    uterine fibroids- dx 09/2021   Medical history non-contributory    Postpartum hypertension 10/22/2022    Past Surgical History:  Procedure Laterality Date   CESAREAN SECTION  09/04/2022   Procedure: CESAREAN SECTION;  Surgeon: Connell Davies, MD;  Location: [facility] ORS;  Service: Obstetrics;;   NO PAST SURGERIES      Family History  Problem Relation Age of Onset   Hypertension Mother    Hypertension Father     Social History   Socioeconomic History   Marital status: Single    Spouse name: Not on file  Number of children: 1   Years of education: 12   Highest education level: Not on file  Occupational History   Occupation: braid hair  Tobacco Use   Smoking status: Every Day    Current packs/day: 0.25    Types: Cigarettes   Smokeless tobacco: Never  Vaping Use   Vaping status: Never Used  Substance and Sexual Activity   Alcohol use: Not Currently    Comment: last use 12/2021   Drug use: No   Sexual activity: Yes    Birth control/protection: None  Other Topics Concern   Not on file  Social History Narrative   Not on file   Social Drivers of  Health   Financial Resource Strain: Medium Risk (02/05/2022)   Overall Financial Resource Strain (CARDIA)    Difficulty of Paying Living Expenses: Somewhat hard  Food Insecurity: No Food Insecurity (09/04/2022)   Hunger Vital Sign    Worried About Running Out of Food in the Last Year: Never true    Ran Out of Food in the Last Year: Never true  Transportation Needs: No Transportation Needs (09/04/2022)   PRAPARE - Administrator, Civil Service (Medical): No    Lack of Transportation (Non-Medical): No  Physical Activity: Inactive (02/05/2022)   Exercise Vital Sign    Days of Exercise per Week: 0 days    Minutes of Exercise per Session: 0 min  Stress: Stress Concern Present (02/05/2022)   [employer] of Occupational Health - Occupational Stress Questionnaire    Feeling of Stress : To some extent  Social Connections: Unknown (02/05/2022)   Social Connection and Isolation Panel    Frequency of Communication with Friends and Family: More than three times a week    Frequency of Social Gatherings with Friends and Family: More than three times a week    Attends Religious Services: Never    Database administrator or Organizations: No    Attends Banker Meetings: Never    Marital Status: Not on file  Intimate Partner Violence: Not At Risk (09/04/2022)   Humiliation, Afraid, Rape, and Kick questionnaire    Fear of Current or Ex-Partner: No    Emotionally Abused: No    Physically Abused: No    Sexually Abused: No    Review of Systems  Constitutional:  Negative for chills and fever.  Gastrointestinal:  Positive for constipation. Negative for heartburn, nausea and vomiting.  Psychiatric/Behavioral:  Negative for depression.         Objective    BP 122/74 (Cuff Size: Large)   Pulse 97   Temp 97.9 F (36.6 C) (Oral)   Resp 16   Ht 5' 2 (1.575 m)   Wt 268 lb 6.4 oz (121.7 kg)   SpO2 99%   BMI 49.09 kg/m   Physical Exam Constitutional:      Appearance:  Normal appearance.  HENT:     Head: Normocephalic and atraumatic.  Eyes:     Conjunctiva/sclera: Conjunctivae normal.  Cardiovascular:     Rate and Rhythm: Normal rate and regular rhythm.  Pulmonary:     Effort: Pulmonary effort is normal.     Breath sounds: Normal breath sounds.  Skin:    General: Skin is warm and dry.  Neurological:     General: No focal deficit present.     Mental Status: She is alert. Mental status is at baseline.  Psychiatric:        Mood and Affect: Mood normal.  Behavior: Behavior normal.     Last CBC Lab Results  Component Value Date   WBC 7.4 09/09/2022   HGB 9.1 (L) 09/09/2022   HCT 27.9 (L) 09/09/2022   MCV 91.8 09/09/2022   MCH 29.9 09/09/2022   RDW 13.2 09/09/2022   PLT 308 09/09/2022   Last metabolic panel Lab Results  Component Value Date   GLUCOSE 77 09/09/2022   NA 138 09/09/2022   K 3.5 09/09/2022   CL 105 09/09/2022   CO2 26 09/09/2022   BUN 12 09/09/2022   CREATININE 0.67 09/09/2022   GFRNONAA >60 09/09/2022   CALCIUM 8.1 (L) 09/09/2022   PROT 6.3 (L) 09/09/2022   ALBUMIN 2.7 (L) 09/09/2022   BILITOT 0.1 (L) 09/09/2022   ALKPHOS 109 09/09/2022   AST 40 09/09/2022   ALT 34 09/09/2022   ANIONGAP 7 09/09/2022   Last lipids Lab Results  Component Value Date   CHOL 198 11/27/2023   HDL 38 (L) 11/27/2023   LDLCALC 141 (H) 11/27/2023   TRIG 84 11/27/2023   CHOLHDL 5.2 (H) 11/27/2023   Last hemoglobin A1c Lab Results  Component Value Date   HGBA1C 5.4 11/27/2023   Last thyroid functions Lab Results  Component Value Date   TSH 0.44 11/27/2023   Last vitamin D No results found for: 25OHVITD2, 25OHVITD3, VD25OH Last vitamin B12 and Folate No results found for: VITAMINB12, FOLATE      Assessment & Plan:   Assessment & Plan  Obesity On Wegovy  0.5 mg, lost six pounds over three months, tolerates well except for constipation. Discussed hydration and fiber for constipation. Advised continuation of  current dose with potential increase if weight loss plateaus. - Continue Wegovy  0.5 mg with refills for three months. - Advise to stay well hydrated. - Recommend fiber supplement for constipation. - Discuss potential increase in Wegovy  dose if weight loss plateaus.  Fibroids Severe menstrual pain postpartum suggests fibroid regrowth. Prefers gynecological evaluation over immediate hormonal birth control. Discussed hormonal mediation of fibroids and management options. - Refer to gynecology for evaluation and management of fibroids. - Prescribe naproxen  for menstrual pain management. - Advise to take naproxen  twice daily as needed, not exceeding twice a day.  Depression and Anxiety Well-managed on current Lexapro  dose. Uses hydroxyzine  for sleep, acceptable. Discussed potential Lexapro  dose increase if needed. - Continue current dose of Lexapro  with a 90-day supply. - Continue hydroxyzine  for sleep as needed.  General Health Maintenance Advised dietary modifications for weight loss, including reducing sugar intake and increasing protein. Recommended hydration to aid weight loss and prevent dehydration. - Advise switching to diet or zero-calorie sodas. - Recommend Premier Protein drinks for increased protein intake. - Encourage hydration with half an ounce of water per pound of body weight daily.  - Semaglutide -Weight Management (WEGOVY ) 0.5 MG/0.5ML SOAJ; Inject 0.5 mg into the skin once a week.  Dispense: 2 mL; Refill: 2 - escitalopram  (LEXAPRO ) 10 MG tablet; Take 1 tablet (10 mg total) by mouth daily.  Dispense: 90 tablet; Refill: 1 - hydrOXYzine  (ATARAX ) 10 MG tablet; Take 1 tablet (10 mg total) by mouth at bedtime as needed for anxiety.  Dispense: 90 tablet; Refill: 1 - Ambulatory referral to Gynecology - naproxen  (NAPROSYN ) 500 MG tablet; Take 1 tablet (500 mg total) by mouth 2 (two) times daily as needed for moderate pain (pain score 4-6).  Dispense: 30 tablet; Refill: 1   Return  in about 6 weeks (around 04/09/2024).   Sharyle Fischer, DO

## 2024-03-16 ENCOUNTER — Other Ambulatory Visit: Payer: Self-pay

## 2024-03-16 NOTE — Patient Outreach (Signed)
 Complex Care Management   Visit Note  03/16/2024  Name:  Jill Kelly MRN: [id number] DOB: Oct 31, 1990  Situation: Referral received for Complex Care Management related to Hyperlipidemia I obtained verbal consent from Patient.  Visit completed with Patient  on the phone  Background:   Past Medical History:  Diagnosis Date   Fibroids    uterine fibroids- dx 09/2021   Medical history non-contributory    Postpartum hypertension 10/22/2022    Assessment: Patient Reported Symptoms:  Cognitive Cognitive Status: Alert and oriented to person, place, and time, Insightful and able to interpret abstract concepts, Normal speech and language skills, Difficulties with attention and concentration (states issues with sleep - can be up 2-3 days without sleep meds, causes issues with memory and concentration) Cognitive/Intellectual Conditions Management [RPT]: None reported or documented in medical history or problem list   Health Maintenance Behaviors: Annual physical exam Healing Pattern: Unsure Health Facilitated by: Rest  Neurological Neurological Review of Symptoms: No symptoms reported Neurological Management Strategies: Routine screening  HEENT HEENT Symptoms Reported: No symptoms reported HEENT Management Strategies: Routine screening    Cardiovascular Cardiovascular Symptoms Reported: No symptoms reported Does patient have uncontrolled Hypertension?: No Cardiovascular Management Strategies: Routine screening  Respiratory Respiratory Symptoms Reported: No symptoms reported Respiratory Management Strategies: Routine screening  Endocrine Endocrine Symptoms Reported: No symptoms reported Is patient diabetic?: No    Gastrointestinal Additional Gastrointestinal Details: constipation since started Wegovy  - metamucil Gastrointestinal Management Strategies: Medication therapy    Genitourinary Additional Genitourinary Details: severe pain with periods - Naproxin, will start would like  new gyn, Genitourinary Management Strategies: Medication therapy Genitourinary Self-Management Outcome: 3 (uncertain)  Integumentary Integumentary Symptoms Reported: Other Other Integumentary Symptoms: skin and scalp dry - lotions    Musculoskeletal Musculoskelatal Symptoms Reviewed: No symptoms reported        Psychosocial Psychosocial Symptoms Reported: Alteration in sleep habits, Difficulty concentrating Additional Psychological Details: reports poor sleep without prescribed medication resulting in memory and concentration issues Behavioral Management Strategies: Medication therapy Behavioral Health Self-Management Outcome: 3 (uncertain) Major Change/Loss/Stressor/Fears (CP): Medical condition, family Techniques to [place] with Loss/Stress/Change: Medication Quality of Family Relationships: stressful (stressful- no one to help with son, cares for parents) Do you feel physically threatened by others?: No      03/16/2024    3:47 PM  Depression screen PHQ 2/9  Decreased Interest 0  Down, Depressed, Hopeless 0  PHQ - 2 Score 0    There were no vitals filed for this visit.  Medications Reviewed Today     Reviewed by Devra Lands, RN (Registered Nurse) on 03/16/24 at 1533  Med List Status: <None>   Medication Order Taking? Sig Documenting Provider Last Dose Status Informant  escitalopram  (LEXAPRO ) 10 MG tablet [id number] Yes Take 1 tablet (10 mg total) by mouth daily. Bernardo Fend, DO  Active   hydrOXYzine  (ATARAX ) 10 MG tablet [id number] Yes Take 1 tablet (10 mg total) by mouth at bedtime as needed for anxiety. Bernardo Fend, DO  Active   naproxen  (NAPROSYN ) 500 MG tablet [id number]  Take 1 tablet (500 mg total) by mouth 2 (two) times daily as needed for moderate pain (pain score 4-6).  Patient not taking: Reported on 03/16/2024   Bernardo Fend, DO  Active   Semaglutide -Weight Management (WEGOVY ) 0.5 MG/0.5ML EMMANUEL [id number] Yes Inject 0.5 mg into the skin once a  week. Bernardo Fend, DO  Active             Recommendation:   PCP Follow-up Continue Current Plan  of Care  Follow Up Plan:   Telephone follow-up 2 Yaacov Koziol  Nestora Duos, MSN, RN [facility]  [facility], [facility] RN Care Manager Direct Dial: [phone] Fax: [phone]

## 2024-03-16 NOTE — Patient Instructions (Signed)
 Visit Information  Jill Kelly was given information about Medicaid Managed Care team care coordination services as a part of their [place] [employer] Medicaid benefit.   If you would like to schedule transportation through your [place] plan, please call the following number at least 2 days in advance of your appointment: [phone].   There is no limit to the number of trips during the year between medical appointments, healthcare facilities, or pharmacies. Transportation must be scheduled at least 2 business days before but not more than thirty 30 days before of your appointment.  Call the Behavioral Health Crisis Line at [phone], at any time, 24 hours a day, 7 days a week. If you are in danger or need immediate medical attention call 911.   Jill Kelly - following are the goals we discussed in your visit today:   Goals Addressed             This Visit's Progress    [facility] RN Care Plan - HLD & Smoking Cessation       Problems:  Chronic Disease Management support and education needs related to HLD  Goal: Over the next 90 days the Patient will attend all scheduled medical appointments: Patient will not miss any appointments  as evidenced by chart review        continue to work with RN Care Manager and/or Social Worker to address care management and care coordination needs related to HLD as evidenced by adherence to care management team scheduled appointments     take all medications exactly as prescribed and will call provider for medication related questions as evidenced by Reports compliance with all medications    verbalize basic understanding of HLD disease process and self health management plan as evidenced by taking medications as prescribed, lifestyle modifications including cardiac healthy diet, exercise, reducing smoking and eventually quitting  Interventions:   Hyperlipidemia Interventions: Medication review performed; medication list updated in  electronic medical record.  Provided HLD educational materials - mailed as requested Reviewed importance of limiting foods high in cholesterol - mailed healthy diet information as requested Reviewed exercise goals and target of 150 minutes per week - discussed options to include son in exercise Screening for signs and symptoms of depression related to chronic disease state Assessed social determinant of health barriers Patient declined SDOH screening RNCM sending message to PCP - patient interested in nicotine patch to help with smoking cessation  Patient Self-Care Activities:  Attend all scheduled provider appointments Call pharmacy for medication refills 3-7 days in advance of running out of medications Call provider office for new concerns or questions  Take medications as prescribed   call for medicine refill 2 or 3 days before it runs out take all medications exactly as prescribed call doctor when you experience any new symptoms go to all doctor appointments as scheduled develop an exercise routine  Plan:  Telephone follow up appointment with care management team member scheduled for:  03/30/2024 at 9:00 am             Please see education materials related to Hyperlipidemia, smoking cessation provided as print materials.   The patient verbalized understanding of instructions, educational materials, and care plan provided today and agreed to receive a mailed copy of patient instructions, educational materials, and care plan.   Telephone follow up appointment with Managed Medicaid care management team member scheduled for: 03/30/2024 at 9:00 am  Nestora Duos, MSN, RN [place]  [facility], [facility] RN Care Manager  Direct Dial: [phone] Fax: [phone]   High Cholesterol  High cholesterol is a condition in which the blood has high levels of a white, waxy substance similar to fat (cholesterol). The liver makes all the cholesterol that  the body needs. The human body needs small amounts of cholesterol to help build cells. A person gets extra or excess cholesterol from the food that he or she eats. The blood carries cholesterol from the liver to the rest of the body. If you have high cholesterol, deposits (plaques) may build up on the walls of your arteries. Arteries are the blood vessels that carry blood away from your heart. These plaques make the arteries narrow and stiff. Cholesterol plaques increase your risk for heart attack and stroke. Work with your health care provider to keep your cholesterol levels in a healthy range. What increases the risk? The following factors may make you more likely to develop this condition: Eating foods that are high in animal fat (saturated fat) or cholesterol. Being overweight. Not getting enough exercise. A family history of high cholesterol (familial hypercholesterolemia). Use of tobacco products. Having diabetes. What are the signs or symptoms? In most cases, high cholesterol does not usually cause any symptoms. In severe cases, very high cholesterol levels can cause: Fatty bumps under the skin (xanthomas). A white or gray ring around the black center (pupil) of the eye. How is this diagnosed? This condition may be diagnosed based on the results of a blood test. If you are older than 35 years of age, your health care provider may check your cholesterol levels every 4-6 years. You may be checked more often if you have high cholesterol or other risk factors for heart disease. The blood test for cholesterol measures: Bad cholesterol, or LDL cholesterol. This is the main type of cholesterol that causes heart disease. The desired level is less than 100 mg/dL (7.40 mmol/L). Good cholesterol, or HDL cholesterol. HDL helps protect against heart disease by cleaning the arteries and carrying the LDL to the liver for processing. The desired level for HDL is 60 mg/dL (8.44 mmol/L) or  higher. Triglycerides. These are fats that your body can store or burn for energy. The desired level is less than 150 mg/dL (8.30 mmol/L). Total cholesterol. This measures the total amount of cholesterol in your blood and includes LDL, HDL, and triglycerides. The desired level is less than 200 mg/dL (4.82 mmol/L). How is this treated? Treatment for high cholesterol starts with lifestyle changes, such as diet and exercise. Diet changes. You may be asked to eat foods that have more fiber and less saturated fats or added sugar. Lifestyle changes. These may include regular exercise, maintaining a healthy weight, and quitting use of tobacco products. Medicines. These are given when diet and lifestyle changes have not worked. You may be prescribed a statin medicine to help lower your cholesterol levels. Follow these instructions at home: Eating and drinking  Eat a healthy, balanced diet. This diet includes: Daily servings of a variety of fresh, frozen, or canned fruits and vegetables. Daily servings of whole grain foods that are rich in fiber. Foods that are low in saturated fats and trans fats. These include poultry and fish without skin, lean cuts of meat, and low-fat dairy products. A variety of fish, especially oily fish that contain omega-3 fatty acids. Aim to eat fish at least 2 times a week. Avoid foods and drinks that have added sugar. Use healthy cooking methods, such as roasting, grilling, broiling, baking, poaching, steaming, and  stir-frying. Do not fry your food except for stir-frying. If you drink alcohol: Limit how much you have to: 0-1 drink a day for women who are not pregnant. 0-2 drinks a day for men. Know how much alcohol is in a drink. In the [place] one drink equals one 12 oz bottle of beer (355 mL), one 5 oz glass of wine (148 mL), or one 1 oz glass of hard liquor (44 mL). Lifestyle  Get regular exercise. Aim to exercise for a total of 150 minutes a week. Increase your  activity level by doing activities such as gardening, walking, and taking the stairs. Do not use any products that contain nicotine or tobacco. These products include cigarettes, chewing tobacco, and vaping devices, such as e-cigarettes. If you need help quitting, ask your health care provider. General instructions Take over-the-counter and prescription medicines only as told by your health care provider. Keep all follow-up visits. This is important. Where to find more information American Heart Association: www.heart.org National Heart, Lung, and Blood Institute: PopSteam.is Contact a health care provider if: You have trouble achieving or maintaining a healthy diet or weight. You are starting an exercise program. You are unable to stop smoking. Get help right away if: You have chest pain. You have trouble breathing. You have discomfort or pain in your jaw, neck, back, shoulder, or arm. You have any symptoms of a stroke. BE FAST is an easy way to remember the main warning signs of a stroke: B - Balance. Signs are dizziness, sudden trouble walking, or loss of balance. E - Eyes. Signs are trouble seeing or a sudden change in vision. F - Face. Signs are sudden weakness or numbness of the face, or the face or eyelid drooping on one side. A - Arms. Signs are weakness or numbness in an arm. This happens suddenly and usually on one side of the body. S - Speech. Signs are sudden trouble speaking, slurred speech, or trouble understanding what people say. T - Time. Time to call emergency services. Write down what time symptoms started. You have other signs of a stroke, such as: A sudden, severe headache with no known cause. Nausea or vomiting. Seizure. These symptoms may represent a serious problem that is an emergency. Do not wait to see if the symptoms will go away. Get medical help right away. Call your local emergency services (911 in the [place] Do not drive yourself to the  hospital. Summary Cholesterol plaques increase your risk for heart attack and stroke. Work with your health care provider to keep your cholesterol levels in a healthy range. Eat a healthy, balanced diet, get regular exercise, and maintain a healthy weight. Do not use any products that contain nicotine or tobacco. These products include cigarettes, chewing tobacco, and vaping devices, such as e-cigarettes. Get help right away if you have any symptoms of a stroke. This information is not intended to replace advice given to you by your health care provider. Make sure you discuss any questions you have with your health care provider. Document Revised: 03/08/2022 Document Reviewed: 10/09/2020 Elsevier Patient Education  2024 Elsevier Inc.    Fat and Cholesterol Restricted Eating Plan Getting too much fat and cholesterol in your diet may cause health problems. Choosing the right foods helps keep your fat and cholesterol at normal levels. This can keep you from getting certain diseases. Your doctor may recommend an eating plan that includes: Total fat: ______% or less of total calories a day. This is ______g of fat  a day. Saturated fat: ______% or less of total calories a day. This is ______g of saturated fat a day. Cholesterol: less than _________mg a day. Fiber: ______g a day. What are tips for following this plan? General tips Work with your doctor to lose weight if you need to. Avoid: Foods with added sugar. Fried foods. Foods with trans fat or partially hydrogenated oils. This includes some margarines and baked goods. If you drink alcohol: Limit how much you have to: 0-1 drink a day for women who are not pregnant. 0-2 drinks a day for men. Know how much alcohol is in a drink. In the [place] one drink equals one 12 oz bottle of beer (355 mL), one 5 oz glass of wine (148 mL), or one 1 oz glass of hard liquor (44 mL). Reading food labels Check food labels for: Trans fats. Partially  hydrogenated oils. Saturated fat (g) in each serving. Cholesterol (mg) in each serving. Fiber (g) in each serving. Choose foods with healthy fats, such as: Monounsaturated fats and polyunsaturated fats. These include olive and canola oil, flaxseeds, walnuts, almonds, and seeds. Omega-3 fats. These are found in certain fish, flaxseed oil, and ground flaxseeds. Choose grain products that have whole grains. Look for the word whole as the first word in the ingredient list. Cooking Cook foods using low-fat methods. These include baking, boiling, grilling, and broiling. Eat more home-cooked foods. Eat at restaurants and buffets less often. Eat less fast food. Avoid cooking using saturated fats, such as butter, cream, palm oil, palm kernel oil, and coconut oil. Meal planning  At meals, divide your plate into four equal parts: Fill one-half of your plate with vegetables, green salads, and fruit. Fill one-fourth of your plate with whole grains. Fill one-fourth of your plate with low-fat (lean) protein foods. Eat fish that is high in omega-3 fats at least two times a week. This includes mackerel, tuna, sardines, and salmon. Eat foods that are high in fiber, such as whole grains, beans, apples, pears, berries, broccoli, carrots, peas, and barley. What foods should I eat? Fruits All fresh, canned (in natural juice), or frozen fruits. Vegetables Fresh or frozen vegetables (raw, steamed, roasted, or grilled). Green salads. Grains Whole grains, such as whole wheat or whole grain breads, crackers, cereals, and pasta. Unsweetened oatmeal, bulgur, barley, quinoa, or brown rice. Corn or whole wheat flour tortillas. Meats and other protein foods Ground beef (85% or leaner), grass-fed beef, or beef trimmed of fat. Skinless chicken or [place]. Ground chicken or [place]. Pork trimmed of fat. All fish and seafood. Egg whites. Dried beans, peas, or lentils. Unsalted nuts or seeds. Unsalted canned beans. Nut  butters without added sugar or oil. Dairy Low-fat or nonfat dairy products, such as skim or 1% milk, 2% or reduced-fat cheeses, low-fat and fat-free ricotta or cottage cheese, or plain low-fat and nonfat yogurt. Fats and oils Tub margarine without trans fats. Light or reduced-fat mayonnaise and salad dressings. Avocado. Olive, canola, sesame, or safflower oils. The items listed above may not be a complete list of foods and beverages you can eat. Contact a dietitian for more information. What foods should I avoid? Fruits Canned fruit in heavy syrup. Fruit in cream or butter sauce. Fried fruit. Vegetables Vegetables cooked in cheese, cream, or butter sauce. Fried vegetables. Grains White bread. White pasta. White rice. Cornbread. Bagels, pastries, and croissants. Crackers and snack foods that contain trans fat and hydrogenated oils. Meats and other protein foods Fatty cuts of meat. Ribs, chicken wings,  bacon, sausage, bologna, salami, chitterlings, fatback, hot dogs, bratwurst, and packaged lunch meats. Liver and organ meats. Whole eggs and egg yolks. Chicken and [place] with skin. Fried meat. Dairy Whole or 2% milk, cream, half-and-half, and cream cheese. Whole milk cheeses. Whole-fat or sweetened yogurt. Full-fat cheeses. Nondairy creamers and whipped toppings. Processed cheese, cheese spreads, and cheese curds. Fats and oils Butter, stick margarine, lard, shortening, ghee, or bacon fat. Coconut, palm kernel, and palm oils. Beverages Alcohol. Sugar-sweetened drinks such as sodas, lemonade, and fruit drinks. Sweets and desserts Corn syrup, sugars, honey, and molasses. Candy. Jam and jelly. Syrup. Sweetened cereals. Cookies, pies, cakes, donuts, muffins, and ice cream. The items listed above may not be a complete list of foods and beverages you should avoid. Contact a dietitian for more information. Summary Choosing the right foods helps keep your fat and cholesterol at normal levels. This can  keep you from getting certain diseases. At meals, fill one-half of your plate with vegetables, green salads, and fruits. Eat high fiber foods, like whole grains, beans, apples, pears, berries, carrots, peas, and barley. Limit added sugar, saturated fats, alcohol, and fried foods. This information is not intended to replace advice given to you by your health care provider. Make sure you discuss any questions you have with your health care provider. Document Revised: 12/15/2020 Document Reviewed: 12/15/2020 Elsevier Patient Education  2024 Elsevier Inc.  Managing the Challenge of Quitting Smoking Quitting smoking is a physical and mental challenge. You may have cravings, withdrawal symptoms, and temptation to smoke. Before quitting, work with your health care provider to make a plan that can help you manage quitting. Making a plan before you quit may keep you from smoking when you have the urge to smoke while trying to quit. How to manage lifestyle changes Managing stress Stress can make you want to smoke, and wanting to smoke may cause stress. It is important to find ways to manage your stress. You could try some of the following: Practice relaxation techniques. Breathe slowly and deeply, in through your nose and out through your mouth. Listen to music. Soak in a bath or take a shower. Imagine a peaceful place or vacation. Get some support. Talk with family or friends about your stress. Join a support group. Talk with a counselor or therapist. Get some physical activity. Go for a walk, run, or bike ride. Play a favorite sport. Practice yoga.  Medicines Talk with your health care provider about medicines that might help you deal with cravings and make quitting easier for you. Relationships Social situations can be difficult when you are quitting smoking. To manage this, you can: Avoid parties and other social situations where people might be smoking. Avoid alcohol. Leave right away if  you have the urge to smoke. Explain to your family and friends that you are quitting smoking. Ask for support and let them know you might be a bit grumpy. Plan activities where smoking is not an option. General instructions Be aware that many people gain weight after they quit smoking. However, not everyone does. To keep from gaining weight, have a plan in place before you quit, and stick to the plan after you quit. Your plan should include: Eating healthy snacks. When you have a craving, it may help to: Eat popcorn, or try carrots, celery, or other cut vegetables. Chew sugar-free gum. Changing how you eat. Eat small portion sizes at meals. Eat 4-6 small meals throughout the day instead of 1-2 large meals a day. Be  mindful when you eat. You should avoid watching television or doing other things that might distract you as you eat. Exercising regularly. Make time to exercise each day. If you do not have time for a long workout, do short bouts of exercise for 5-10 minutes several times a day. Do some form of strengthening exercise, such as weight lifting. Do some exercise that gets your heart beating and causes you to breathe deeply, such as walking fast, running, swimming, or biking. This is very important. Drinking plenty of water or other low-calorie or no-calorie drinks. Drink enough fluid to keep your urine pale yellow.  How to recognize withdrawal symptoms Your body and mind may experience discomfort as you try to get used to not having nicotine in your system. These effects are called withdrawal symptoms. They may include: Feeling hungrier than normal. Having trouble concentrating. Feeling irritable or restless. Having trouble sleeping. Feeling depressed. Craving a cigarette. These symptoms may surprise you, but they are normal to have when quitting smoking. To manage withdrawal symptoms: Avoid places, people, and activities that trigger your cravings. Remember why you want to  quit. Get plenty of sleep. Avoid coffee and other drinks that contain caffeine. These may worsen some of your symptoms. How to manage cravings Come up with a plan for how to deal with your cravings. The plan should include the following: A definition of the specific situation you want to deal with. An activity or action you will take to replace smoking. A clear idea for how this action will help. The name of someone who could help you with this. Cravings usually last for 5-10 minutes. Consider taking the following actions to help you with your plan to deal with cravings: Keep your mouth busy. Chew sugar-free gum. Suck on hard candies or a straw. Brush your teeth. Keep your hands and body busy. Change to a different activity right away. Squeeze or play with a ball. Do an activity or a hobby, such as making bead jewelry, practicing needlepoint, or working with wood. Mix up your normal routine. Take a short exercise break. Go for a quick walk, or run up and down stairs. Focus on doing something kind or helpful for someone else. Call a friend or family member to talk during a craving. Join a support group. Contact a quitline. Where to find support To get help or find a support group: Call the National Cancer Institute's Smoking Quitline: 1-800-QUIT-NOW (786)751-3404) Text QUIT to SmokefreeTXT: 521151 Where to find more information Visit these websites to find more information on quitting smoking: [place] Department of Health and Human Services: www.smokefree.gov American Lung Association: www.freedomfromsmoking.org Centers for Disease Control and Prevention (CDC): FootballExhibition.com.br American Heart Association: www.heart.org Contact a health care provider if: You want to change your plan for quitting. The medicines you are taking are not helping. Your eating feels out of control or you cannot sleep. You feel depressed or become very anxious. Summary Quitting smoking is a physical and mental  challenge. You will face cravings, withdrawal symptoms, and temptation to smoke again. Preparation can help you as you go through these challenges. Try different techniques to manage stress, handle social situations, and prevent weight gain. You can deal with cravings by keeping your mouth busy (such as by chewing gum), keeping your hands and body busy, calling family or friends, or contacting a quitline for people who want to quit smoking. You can deal with withdrawal symptoms by avoiding places where people smoke, getting plenty of rest, and avoiding drinks  that contain caffeine. This information is not intended to replace advice given to you by your health care provider. Make sure you discuss any questions you have with your health care provider. Document Revised: 07/27/2021 Document Reviewed: 07/27/2021 Elsevier Patient Education  2024 [employer].

## 2024-03-22 ENCOUNTER — Ambulatory Visit: Admitting: Licensed Clinical Social Worker

## 2024-03-23 ENCOUNTER — Other Ambulatory Visit: Payer: Self-pay | Admitting: Internal Medicine

## 2024-03-23 DIAGNOSIS — Z720 Tobacco use: Secondary | ICD-10-CM

## 2024-03-23 MED ORDER — NICOTINE 14 MG/24HR TD PT24: 14.0000 mg | MEDICATED_PATCH | Freq: Every day | TRANSDERMAL | 0 refills | Status: AC

## 2024-03-30 ENCOUNTER — Telehealth: Payer: Self-pay

## 2024-03-30 NOTE — Patient Instructions (Signed)
 Margean Rojean Shoulder - I am sorry I was unable to reach you today for our scheduled appointment. I work with Bernardo Fend, DO and am calling to support your healthcare needs. Please contact me at [phone] at your earliest convenience. I look forward to speaking with you soon.   Thank you,  Nestora Duos, MSN, RN [facility]  [facility], [facility] RN Care Manager Direct Dial: [phone] Fax: [phone]

## 2024-04-09 ENCOUNTER — Ambulatory Visit: Admitting: Internal Medicine

## 2024-04-13 ENCOUNTER — Ambulatory Visit: Admitting: Obstetrics and Gynecology

## 2024-04-13 DIAGNOSIS — D259 Leiomyoma of uterus, unspecified: Secondary | ICD-10-CM

## 2024-04-13 DIAGNOSIS — N946 Dysmenorrhea, unspecified: Secondary | ICD-10-CM

## 2024-04-29 ENCOUNTER — Other Ambulatory Visit: Payer: Self-pay | Admitting: Internal Medicine

## 2024-04-29 DIAGNOSIS — D219 Benign neoplasm of connective and other soft tissue, unspecified: Secondary | ICD-10-CM

## 2024-05-13 ENCOUNTER — Other Ambulatory Visit

## 2024-05-13 DIAGNOSIS — F418 Other specified anxiety disorders: Secondary | ICD-10-CM

## 2024-05-13 NOTE — Patient Instructions (Signed)
 Visit Information  Ms. Jill Kelly was given information about Medicaid Managed Care team care coordination services as a part of their [place] [employer] benefit.   If you would like to schedule transportation through your [place] [employer] plan, please call the following number at least 2 days in advance of your appointment: [phone].   There is no limit to the number of trips during the year between medical appointments, healthcare facilities, or pharmacies. Transportation must be scheduled at least 2 business days before but not more than thirty 30 days before of your appointment.  Call the Behavioral Health Crisis Line at [phone], at any time, 24 hours a day, 7 days a week. If you are in danger or need immediate medical attention call 911.   Please see education materials related to depression/anxiety, fibroids provided by MyChart link.  Patient verbalizes understanding of instructions and care plan provided today and agrees to view in MyChart. Active MyChart status and patient understanding of how to access instructions and care plan via MyChart confirmed with patient.     Telephone follow up appointment with Managed Medicaid care management team member scheduled for:06/02/2024 at 1:00 pm  Nestora Duos, MSN, RN [facility]  [facility], [facility] RN Care Manager Direct Dial: [phone] Fax: [phone]  Managing Depression, Adult Depression is a mental health condition that affects your thoughts, feelings, and actions. Being diagnosed with depression can bring you relief if you did not know why you have felt or behaved a certain way. It could also leave you feeling overwhelmed. Finding ways to manage your symptoms can help you feel more positive about your future. How to manage lifestyle changes Being depressed is difficult. Depression can increase the level of everyday stress. Stress can make depression symptoms worse. You may  believe your symptoms cannot be managed or will never improve. However, there are many things you can try to help manage your symptoms. There is hope. Managing stress  Stress is your body's reaction to life changes and events, both good and bad. Stress can add to your feelings of depression. Learning to manage your stress can help lessen your feelings of depression. Try some of the following approaches to reducing your stress (stress reduction techniques): Listen to music that you enjoy and that inspires you. Try using a meditation app or take a meditation class. Develop a practice that helps you connect with your spiritual self. Walk in nature, pray, or go to a place of worship. Practice deep breathing. To do this, inhale slowly through your nose. Pause at the top of your inhale for a few seconds and then exhale slowly, letting yourself relax. Repeat this three or four times. Practice yoga to help relax and work your muscles. Choose a stress reduction technique that works for you. These techniques take time and practice to develop. Set aside 5-15 minutes a day to do them. Therapists can offer training in these techniques. Do these things to help manage stress: Keep a journal. Know your limits. Set healthy boundaries for yourself and others, such as saying no when you think something is too much. Pay attention to how you react to certain situations. You may not be able to control everything, but you can change your reaction. Add humor to your life by watching funny movies or shows. Make time for activities that you enjoy and that relax you. Spend less time using electronics, especially at night before bed. The light from screens can make your brain think it is time  to get up rather than go to bed.  Medicines Medicines, such as antidepressants, are often a part of treatment for depression. Talk with your pharmacist or health care provider about all the medicines, supplements, and herbal products  that you take, their possible side effects, and what medicines and other products are safe to take together. Make sure to report any side effects you may have to your health care provider. Relationships Your health care provider may suggest family therapy, couples therapy, or individual therapy as part of your treatment. How to recognize changes Everyone responds differently to treatment for depression. As you recover from depression, you may start to: Have more interest in doing activities. Feel more hopeful. Have more energy. Eat a more regular amount of food. Have better mental focus. It is important to recognize if your depression is not getting better or is getting worse. The symptoms you had in the beginning may return, such as: Feeling tired. Eating too much or too little. Sleeping too much or too little. Feeling restless, agitated, or hopeless. Trouble focusing or making decisions. Having unexplained aches and pains. Feeling irritable, angry, or aggressive. If you or your family members notice these symptoms coming back, let your health care provider know right away. Follow these instructions at home: Activity Try to get some form of exercise each day, such as walking. Try yoga, mindfulness, or other stress reduction techniques. Participate in group activities if you are able. Lifestyle Get enough sleep. Cut down on or stop using caffeine, tobacco, alcohol, and any other harmful substances. Eat a healthy diet that includes plenty of vegetables, fruits, whole grains, low-fat dairy products, and lean protein. Limit foods that are high in solid fats, added sugar, or salt (sodium). General instructions Take over-the-counter and prescription medicines only as told by your health care provider. Keep all follow-up visits. It is important for your health care provider to check on your mood, behavior, and medicines. Your health care provider may need to make changes to your  treatment. Where to find support Talking to others  Friends and family members can be sources of support and guidance. Talk to trusted friends or family members about your condition. Explain your symptoms and let them know that you are working with a health care provider to treat your depression. Tell friends and family how they can help. Finances Find mental health providers that fit with your financial situation. Talk with your health care provider if you are worried about access to food, housing, or medicine. Call your insurance company to learn about your co-pays and prescription plan. Where to find more information You can find support in your area from: Anxiety and Depression Association of America (ADAA): adaa.org Mental Health America: mentalhealthamerica.net The First American on Mental Illness: nami.org Contact a health care provider if: You stop taking your antidepressant medicines, and you have any of these symptoms: Nausea. Headache. Light-headedness. Chills and body aches. Not being able to sleep (insomnia). You or your friends and family think your depression is getting worse. Get help right away if: You have thoughts of hurting yourself or others. Get help right away if you feel like you may hurt yourself or others, or have thoughts about taking your own life. Go to your nearest emergency room or: Call 911. Call the National Suicide Prevention Lifeline at 734 824 6333 or 988. This is open 24 hours a day. Text the Crisis Text Line at 269 546 0816. This information is not intended to replace advice given to you by your health care  provider. Make sure you discuss any questions you have with your health care provider. Document Revised: 12/11/2021 Document Reviewed: 12/11/2021 Elsevier Patient Education  2024 Elsevier Inc.  Managing Anxiety, Adult After being diagnosed with anxiety, you may be relieved to know why you have felt or behaved a certain way. You may also feel  overwhelmed about the treatment ahead and what it will mean for your life. With care and support, you can manage your anxiety. How to manage lifestyle changes Understanding the difference between stress and anxiety Although stress can play a role in anxiety, it is not the same as anxiety. Stress is your body's reaction to life changes and events, both good and bad. Stress is often caused by something external, such as a deadline, test, or competition. It normally goes away after the event has ended and will last just a few hours. But, stress can be ongoing and can lead to more than just stress. Anxiety is caused by something internal, such as imagining a terrible outcome or worrying that something will go wrong that will greatly upset you. Anxiety often does not go away even after the event is over, and it can become a long-term (chronic) worry. Lowering stress and anxiety Talk with your health care provider or a counselor to learn more about lowering anxiety and stress. They may suggest tension-reduction techniques, such as: Music. Spend time creating or listening to music that you enjoy and that inspires you. Mindfulness-based meditation. Practice being aware of your normal breaths while not trying to control your breathing. It can be done while sitting or walking. Centering prayer. Focus on a word, phrase, or sacred image that means something to you and brings you peace. Deep breathing. Expand your stomach and inhale slowly through your nose. Hold your breath for 3-5 seconds. Then breathe out slowly, letting your stomach muscles relax. Self-talk. Learn to notice and spot thought patterns that lead to anxiety reactions. Change those patterns to thoughts that feel peaceful. Muscle relaxation. Take time to tense muscles and then relax them. Choose a tension-reduction technique that fits your lifestyle and personality. These techniques take time and practice. Set aside 5-15 minutes a day to do them.  Specialized therapists can offer counseling and training in these techniques. The training to help with anxiety may be covered by some insurance plans. Other things you can do to manage stress and anxiety include: Keeping a stress diary. This can help you learn what triggers your reaction and then learn ways to manage your response. Thinking about how you react to certain situations. You may not be able to control everything, but you can control your response. Making time for activities that help you relax and not feeling guilty about spending your time in this way. Doing visual imagery. This involves imagining or creating mental pictures to help you relax. Practicing yoga. Through yoga poses, you can lower tension and relax.  Medicines Medicines for anxiety include: Antidepressant medicines. These are usually prescribed for long-term daily control. Anti-anxiety medicines. These may be added in severe cases, especially when panic attacks occur. When used together, medicines, psychotherapy, and tension-reduction techniques may be the most effective treatment. Relationships Relationships can play a big part in helping you recover. Spend more time connecting with trusted friends and family members. Think about going to couples counseling if you have a partner, taking family education classes, or going to family therapy. Therapy can help you and others better understand your anxiety. How to recognize changes in your anxiety Everyone  responds differently to treatment for anxiety. Recovery from anxiety happens when symptoms lessen and stop interfering with your daily life at home or work. This may mean that you will start to: Have better concentration and focus. Worry will interfere less in your daily thinking. Sleep better. Be less irritable. Have more energy. Have improved memory. Try to recognize when your condition is getting worse. Contact your provider if your symptoms interfere with home or  work and you feel like your condition is not improving. Follow these instructions at home: Activity Exercise. Adults should: Exercise for at least 150 minutes each week. The exercise should increase your heart rate and make you sweat (moderate-intensity exercise). Do strengthening exercises at least twice a week. Get the right amount and quality of sleep. Most adults need 7-9 hours of sleep each night. Lifestyle  Eat a healthy diet that includes plenty of vegetables, fruits, whole grains, low-fat dairy products, and lean protein. Do not eat a lot of foods that are high in fats, added sugars, or salt (sodium). Make choices that simplify your life. Do not use any products that contain nicotine  or tobacco. These products include cigarettes, chewing tobacco, and vaping devices, such as e-cigarettes. If you need help quitting, ask your provider. Avoid caffeine, alcohol, and certain over-the-counter cold medicines. These may make you feel worse. Ask your pharmacist which medicines to avoid. General instructions Take over-the-counter and prescription medicines only as told by your provider. Keep all follow-up visits. This is to make sure you are managing your anxiety well or if you need more support. Where to find support You can get help and support from: Self-help groups. Online and [employer]. A trusted spiritual leader. Couples counseling. Family education classes. Family therapy. Where to find more information You may find that joining a support group helps you deal with your anxiety. The following sources can help you find counselors or support groups near you: Mental Health America: mentalhealthamerica.net Anxiety and Depression Association of [place] (ADAA): adaa.org The First American on Mental Illness (NAMI): nami.org Contact a health care provider if: You have a hard time staying focused or finishing tasks. You spend many hours a day feeling worried about everyday  life. You are very tired because you cannot stop worrying. You start to have headaches or often feel tense. You have chronic nausea or diarrhea. Get help right away if: Your heart feels like it is racing. You have shortness of breath. You have thoughts of hurting yourself or others. Get help right away if you feel like you may hurt yourself or others, or have thoughts about taking your own life. Go to your nearest emergency room or: Call 911. Call the National Suicide Prevention Lifeline at 681-519-2401 or 988. This is open 24 hours a day. Text the Crisis Text Line at 847-124-4828. This information is not intended to replace advice given to you by your health care provider. Make sure you discuss any questions you have with your health care provider. Document Revised: 05/14/2022 Document Reviewed: 11/26/2020 Elsevier Patient Education  2024 Elsevier Inc.   Fibroids in the Uterus: What to Know  Fibroids are growths (tumors) in the uterus. Fibroids are not cancer. Most people with this condition do not need treatment. Sometimes, fibroids can make it hard to get pregnant. If this happens, you may need surgery to take out the fibroids. What are the causes? The cause of this condition is not known. What increases the risk? You're in your 30s or 40s and have not stopped having  a period. You have family members who have had fibroids. You're of African American descent. You started your period at age 89 or younger. You've not given birth. You're overweight or you're obese. You eat a diet low in fruits, vegetables, and vitamin D. What are the signs or symptoms? Heavy bleeding during your period. Bleeding between periods. Pain in the area between your hips (pelvis). Pain during sex. Needing to pee right away or peeing more often than usual. Not being able to have children. Not being able to stay pregnant (miscarriage). Many people do not have symptoms. How is this treated? Treatment may  include: Medicines to help with pain, such as aspirin or ibuprofen . Hormone treatment. This may be given as a pill, in a shot, or with a type of birth control device called an IUD. Surgery. This may be done to: Take out the fibroids. This may be done if you want to become pregnant. Take out the uterus. Stop the blood flow to the fibroids. Procedures to shrink the fibroids. This may be done with: Heat and radio energy. Ultrasound waves. Follow these instructions at home: Medicines Take your medicines only as told. You can lose a lot of iron because of heavy bleeding from your period. Ask your doctor if you should: Take iron pills. Eat more foods that have iron in them, such as dark green, leafy vegetables. Managing pain  Put heat on your back or belly as told. Use the heat source that your doctor recommends, such as a moist heat pack or a heating pad. Do this as often as told. Put a towel between your skin and the heat source. Leave the heat on for 20-30 minutes. If your skin turns red, take off the heat right away to prevent burns. The risk of burns is higher if you can't feel pain, heat, or cold. General instructions Tell your doctor about any changes in your period, such as: Heavy bleeding that needs a change of tampons or pads more than normal. A change in how many days your period lasts. A change in symptoms that come with your period. This might be cramps in your belly or pain in your back. Keep all follow-up visits. Your doctor needs to check your fibroids for any changes. Contact a doctor if: You have pain that does not get better with medicine or heat. This may include pain or cramps in: The area between your hip bones. Your back. Your belly. You have new bleeding between your periods. You have more bleeding during or between your periods. You feel very tired or weak. You feel dizzy. Get help right away if: You faint. You have pain in the area between your hip bones  that gets worse. You have bleeding that soaks a tampon or pad in 30 minutes or less. This information is not intended to replace advice given to you by your health care provider. Make sure you discuss any questions you have with your health care provider. Document Revised: 02/04/2023 Document Reviewed: 02/04/2023 Elsevier Patient Education  2024 [employer].  Following is a copy of your plan of care:  There are no care plans that you recently modified to display for this patient.

## 2024-05-13 NOTE — Progress Notes (Signed)
 Hi Jill Kelly,   Pt has scheduled appt with RN on 06/02/24. No need for reschedule closed out RS.   Thank you, Shereen Gin [facility] Assistant Direct Dial: [phone]  Fax: [phone] Website: [email or web address]

## 2024-05-13 NOTE — Patient Outreach (Signed)
 Complex Care Management   Visit Note  05/13/2024  Name:  Jill Kelly MRN: [id number] DOB: Dec 17, 1990  Situation: Referral received for Complex Care Management related to Anxiety/Depression HLD I obtained verbal consent from Patient.  Visit completed with Patient  on the phone  Background:   Past Medical History:  Diagnosis Date   Fibroids    uterine fibroids- dx 09/2021   Medical history non-contributory    Postpartum hypertension 10/22/2022    Assessment: Patient Reported Symptoms:  Cognitive Cognitive Status: Alert and oriented to person, place, and time, Insightful and able to interpret abstract concepts, Normal speech and language skills Cognitive/Intellectual Conditions Management [RPT]: None reported or documented in medical history or problem list   Health Maintenance Behaviors: Annual physical exam Healing Pattern: Unsure  Neurological Neurological Review of Symptoms: No symptoms reported Neurological Management Strategies: Routine screening  HEENT HEENT Symptoms Reported: No symptoms reported HEENT Management Strategies: Routine screening    Cardiovascular Cardiovascular Symptoms Reported: No symptoms reported Does patient have uncontrolled Hypertension?: No Cardiovascular Management Strategies: Routine screening Cardiovascular Comment: patient reports smoking but not every day - deferred discussion due to focus on anxiety/depression  Respiratory Respiratory Symptoms Reported: No symptoms reported Respiratory Management Strategies: Routine screening  Endocrine Endocrine Symptoms Reported: Not assessed Is patient diabetic?: No    Gastrointestinal Gastrointestinal Symptoms Reported: Constipation Additional Gastrointestinal Details: patient reported stopping Wegovy  because not losing as much weight as expected, frustrated, dose not being increased as expected, missed PCP appointment, agreed to call and make appointment to specifically discuss these concerns and  possible alternatives to Wegovy  Gastrointestinal Management Strategies: Medication therapy    Genitourinary Additional Genitourinary Details: debilitating periods due to fibroids, interfering with ADLs and mood, discussed need to follow up with gyn, stated preferred to go to someone else, [facility] requesting referral for patient to new gyn, naprosyn  for pain with little relief Genitourinary Management Strategies: Medication therapy  Integumentary Integumentary Symptoms Reported: No symptoms reported    Musculoskeletal Musculoskelatal Symptoms Reviewed: No symptoms reported   Falls in the past year?: No Number of falls in past year: 1 or less Was there an injury with Fall?: No Fall Risk Category Calculator: 0 Patient Fall Risk Level: Low Fall Risk Patient at Risk for Falls Due to: No Fall Risks Fall risk Follow up: Falls evaluation completed  Psychosocial Psychosocial Symptoms Reported: Anxiety - if selected complete GAD, Depression - if selected complete PHQ 2-9 Additional Psychological Details: reports depressed and anxiety, stopped taking meds and ging to appointments, was up for 2-3 days last week, did not get call to schedule with [facility], given number and will try to call them back, agreed to LCSW referral in interim and scheduled, agreed to restart Lexapro  and get refill of Atarax  to facilitate sleep, discussed options if SI Behavioral Management Strategies: Medication therapy Behavioral Health Comment: feels overwhelmed and down, cannot work due to not having childcare, cannot get out to find job and provide for herself and child Major Change/Loss/Stressor/Fears (CP): Medical condition, self, School or job Techniques to [employer] with Loss/Stress/Change: Medication, Withdraw Quality of Family Relationships: supportive, involved, helpful Do you feel physically threatened by others?: No    05/13/2024    PHQ2-9 Depression Screening   Little interest or pleasure in doing things Several days   Feeling down, depressed, or hopeless Several days  PHQ-2 - Total Score 2  Trouble falling or staying asleep, or sleeping too much Nearly every day  Feeling tired or having little energy Nearly every day  Poor appetite or overeating  More than half the days  Feeling bad about yourself - or that you are a failure or have let yourself or your family down Nearly every day  Trouble concentrating on things, such as reading the newspaper or watching television Nearly every day  Moving or speaking so slowly that other people could have noticed.  Or the opposite - being so fidgety or restless that you have been moving around a lot more than usual Several days  Thoughts that you would be better off dead, or hurting yourself in some way Not at all  PHQ2-9 Total Score 17  If you checked off any problems, how difficult have these problems made it for you to do your work, take care of things at home, or get along with other people Very difficult  Depression Interventions/Treatment  (referral to LCSW)    There were no vitals filed for this visit.  Medications Reviewed Today     Reviewed by Devra Lands, RN (Registered Nurse) on 05/13/24 at 1324  Med List Status: <None>   Medication Order Taking? Sig Documenting Provider Last Dose Status Informant  escitalopram  (LEXAPRO ) 10 MG tablet [id number]  Take 1 tablet (10 mg total) by mouth daily.  Patient not taking: Reported on 05/13/2024   Bernardo Fend, DO  Active   hydrOXYzine  (ATARAX ) 10 MG tablet [id number]  Take 1 tablet (10 mg total) by mouth at bedtime as needed for anxiety.  Patient not taking: Reported on 05/13/2024   Bernardo Fend, DO  Active   naproxen  (NAPROSYN ) 500 MG tablet [id number] Yes Take 1 tablet (500 mg total) by mouth 2 (two) times daily as needed for moderate pain (pain score 4-6). Bernardo Fend, DO  Active   nicotine  (NICODERM CQ  - DOSED IN MG/24 HOURS) 14 mg/24hr patch [id number]  Place 1 patch (14 mg total) onto the  skin daily.  Patient not taking: Reported on 05/13/2024   Bernardo Fend, DO  Active   Semaglutide -Weight Management (WEGOVY ) 0.5 MG/0.5ML EMMANUEL [id number]  Inject 0.5 mg into the skin once a week.  Patient not taking: Reported on 05/13/2024   Bernardo Fend, DO  Active             Recommendation:   PCP Follow-up Referral to: LCSW 06/02/2024 at 11:00 am Continue Current Plan of Care  Follow Up Plan:   Telephone follow-up 2 Taelyn Nemes  Lands Devra, MSN, RN [facility], [facility] RN Care Manager Direct Dial: [phone] Fax: [phone]

## 2024-05-26 ENCOUNTER — Ambulatory Visit: Admitting: Internal Medicine

## 2024-06-02 ENCOUNTER — Other Ambulatory Visit: Payer: Self-pay

## 2024-06-02 ENCOUNTER — Other Ambulatory Visit: Payer: Self-pay | Admitting: *Deleted

## 2024-06-02 NOTE — Patient Instructions (Signed)
 Visit Information  Jill Kelly was given information about Medicaid Managed Care team care coordination services as a part of their [place] Medicaid benefit.   If you would like to schedule transportation through your [place] plan, please call the following number at least 2 days in advance of your appointment: [phone].   There is no limit to the number of trips during the year between medical appointments, healthcare facilities, or pharmacies. Transportation must be scheduled at least 2 business days before but not more than thirty 30 days before of your appointment.  Call the Behavioral Health Crisis Line at [phone], at any time, 24 hours a day, 7 days a week. If you are in danger or need immediate medical attention call 911.   Please see education materials related to Insomnia provided by MyChart link.  Patient verbalizes understanding of instructions and care plan provided today and agrees to view in MyChart. Active MyChart status and patient understanding of how to access instructions and care plan via MyChart confirmed with patient.     Telephone follow up appointment with Managed Medicaid care management team member scheduled for: 06/30/2024 1:30 pm  Nestora Duos, MSN, RN [place]  [facility], [facility] RN Care Manager Direct Dial: [phone] Fax: [phone]   Insomnia Insomnia is a sleep disorder that makes it difficult to fall asleep or stay asleep. Insomnia can cause fatigue, low energy, difficulty concentrating, mood swings, and poor performance at work or school. There are three different ways to classify insomnia: Difficulty falling asleep. Difficulty staying asleep. Waking up too early in the morning. Any type of insomnia can be long-term (chronic) or short-term (acute). Both are common. Short-term insomnia usually lasts for 3 months or less. Chronic insomnia occurs at least three times a week for  longer than 3 months. What are the causes? Insomnia may be caused by another condition, situation, or substance, such as: Having certain mental health conditions, such as anxiety and depression. Using caffeine, alcohol, tobacco, or drugs. Having gastrointestinal conditions, such as gastroesophageal reflux disease (GERD). Having certain medical conditions. These include: Asthma. Alzheimer's disease. Stroke. Chronic pain. An overactive thyroid gland (hyperthyroidism). Other sleep disorders, such as restless legs syndrome and sleep apnea. Menopause. Sometimes, the cause of insomnia may not be known. What increases the risk? Risk factors for insomnia include: Gender. Females are affected more often than males. Age. Insomnia is more common as people get older. Stress and certain medical and mental health conditions. Lack of exercise. Having an irregular work schedule. This may include working night shifts and traveling between different time zones. What are the signs or symptoms? If you have insomnia, the main symptom is having trouble falling asleep or having trouble staying asleep. This may lead to other symptoms, such as: Feeling tired or having low energy. Feeling nervous about going to sleep. Not feeling rested in the morning. Having trouble concentrating. Feeling irritable, anxious, or depressed. How is this diagnosed? This condition may be diagnosed based on: Your symptoms and medical history. Your health care provider may ask about: Your sleep habits. Any medical conditions you have. Your mental health. A physical exam. How is this treated? Treatment for insomnia depends on the cause. Treatment may focus on treating an underlying condition that is causing the insomnia. Treatment may also include: Medicines to help you sleep. Counseling or therapy. Lifestyle adjustments to help you sleep better. Follow these instructions at home: Eating and drinking  Limit or avoid  alcohol, caffeinated beverages, and  products that contain nicotine  and tobacco, especially close to bedtime. These can disrupt your sleep. Do not eat a large meal or eat spicy foods right before bedtime. This can lead to digestive discomfort that can make it hard for you to sleep. Sleep habits  Keep a sleep diary to help you and your health care provider figure out what could be causing your insomnia. Write down: When you sleep. When you wake up during the night. How well you sleep and how rested you feel the next day. Any side effects of medicines you are taking. What you eat and drink. Make your bedroom a dark, comfortable place where it is easy to fall asleep. Put up shades or blackout curtains to block light from outside. Use a white noise machine to block noise. Keep the temperature cool. Limit screen use before bedtime. This includes: Not watching TV. Not using your smartphone, tablet, or computer. Stick to a routine that includes going to bed and waking up at the same times every day and night. This can help you fall asleep faster. Consider making a quiet activity, such as reading, part of your nighttime routine. Try to avoid taking naps during the day so that you sleep better at night. Get out of bed if you are still awake after 15 minutes of trying to sleep. Keep the lights down, but try reading or doing a quiet activity. When you feel sleepy, go back to bed. General instructions Take over-the-counter and prescription medicines only as told by your health care provider. Exercise regularly as told by your health care provider. However, avoid exercising in the hours right before bedtime. Use relaxation techniques to manage stress. Ask your health care provider to suggest some techniques that may work well for you. These may include: Breathing exercises. Routines to release muscle tension. Visualizing peaceful scenes. Make sure that you drive carefully. Do not drive if you feel very  sleepy. Keep all follow-up visits. This is important. Contact a health care provider if: You are tired throughout the day. You have trouble in your daily routine due to sleepiness. You continue to have sleep problems, or your sleep problems get worse. Get help right away if: You have thoughts about hurting yourself or someone else. Get help right away if you feel like you may hurt yourself or others, or have thoughts about taking your own life. Go to your nearest emergency room or: Call 911. Call the National Suicide Prevention Lifeline at 865-305-7841 or 988. This is open 24 hours a day. Text the Crisis Text Line at 863-310-2543. Summary Insomnia is a sleep disorder that makes it difficult to fall asleep or stay asleep. Insomnia can be long-term (chronic) or short-term (acute). Treatment for insomnia depends on the cause. Treatment may focus on treating an underlying condition that is causing the insomnia. Keep a sleep diary to help you and your health care provider figure out what could be causing your insomnia. This information is not intended to replace advice given to you by your health care provider. Make sure you discuss any questions you have with your health care provider. Document Revised: 07/16/2021 Document Reviewed: 07/16/2021 Elsevier Patient Education  2024 [employer].  Following is a copy of your plan of care:  There are no care plans that you recently modified to display for this patient.

## 2024-06-02 NOTE — Patient Outreach (Signed)
 Complex Care Management   Visit Note  06/02/2024  Name:  Jill Kelly MRN: [id number] DOB: 1991-01-03  Situation: Referral received for Complex Care Management related to HLD Smoking Cessation Depression/Anxiety I obtained verbal consent from Patient.  Visit completed with Patient  on the phone  Background:   Past Medical History:  Diagnosis Date   Fibroids    uterine fibroids- dx 09/2021   Medical history non-contributory    Postpartum hypertension 10/22/2022    Assessment: Patient Reported Symptoms:  Cognitive Cognitive Status: No symptoms reported      Neurological Neurological Review of Symptoms: No symptoms reported    HEENT HEENT Symptoms Reported: Not assessed      Cardiovascular Cardiovascular Symptoms Reported: No symptoms reported    Respiratory Respiratory Symptoms Reported: No symptoms reported    Endocrine Endocrine Symptoms Reported: Not assessed Is patient diabetic?: No    Gastrointestinal Gastrointestinal Symptoms Reported: Not assessed      Genitourinary Additional Genitourinary Details: fibroid pain 8/10 last night - naprosyn  helps with pain, getting refill, used Aleve  last night, no relief with heat, deep burning pain, has GYN appointment 07/27/24 Genitourinary Management Strategies: Medication therapy Genitourinary Self-Management Outcome: 3 (uncertain)  Integumentary Integumentary Symptoms Reported: Not assessed    Musculoskeletal Musculoskelatal Symptoms Reviewed: Not assessed   Falls in the past year?: No Number of falls in past year: 1 or less Was there an injury with Fall?: No Fall Risk Category Calculator: 0 Patient Fall Risk Level: Low Fall Risk Patient at Risk for Falls Due to: No Fall Risks Fall risk Follow up: Falls evaluation completed  Psychosocial Psychosocial Symptoms Reported: Anxiety - if selected complete GAD, Depression - if selected complete PHQ 2-9 Additional Psychological Details: not getting a lot of sleep - get a  little antsy at times - I am not crazy I need to get my medication but I do not like taking meds, will call fro refills on medications (Lexapro  and Atarax )discussed talking to PCP and GYN about poor sleep, anxious mood, just completed visit with CCM LCSW, requested focused assesement with RNCM, concern re lack of childcare, missing appointments - discussed cancelling to avoid dismissal, reports on state waiting list for childcare, requesting additional resources, RNCM will request from LCSW Behavioral Management Strategies: Medication therapy Major Change/Loss/Stressor/Fears (CP): Medical condition, self, School or job Techniques to [employer] with Loss/Stress/Change: Withdraw, Medication Quality of Family Relationships: supportive    06/02/2024    PHQ2-9 Depression Screening   Little interest or pleasure in doing things    Feeling down, depressed, or hopeless    PHQ-2 - Total Score    Trouble falling or staying asleep, or sleeping too much    Feeling tired or having little energy    Poor appetite or overeating     Feeling bad about yourself - or that you are a failure or have let yourself or your family down    Trouble concentrating on things, such as reading the newspaper or watching television    Moving or speaking so slowly that other people could have noticed.  Or the opposite - being so fidgety or restless that you have been moving around a lot more than usual    Thoughts that you would be better off dead, or hurting yourself in some way    PHQ2-9 Total Score    If you checked off any problems, how difficult have these problems made it for you to do your work, take care of things at home, or get along  with other people    Depression Interventions/Treatment      There were no vitals filed for this visit.  Medications Reviewed Today     Reviewed by Devra Lands, RN (Registered Nurse) on 06/02/24 at 1303  Med List Status: <None>   Medication Order Taking? Sig Documenting Provider  Last Dose Status Informant  escitalopram  (LEXAPRO ) 10 MG tablet [id number]  Take 1 tablet (10 mg total) by mouth daily.  Patient not taking: Reported on 06/02/2024   Bernardo Fend, DO  Active   hydrOXYzine  (ATARAX ) 10 MG tablet [id number]  Take 1 tablet (10 mg total) by mouth at bedtime as needed for anxiety.  Patient not taking: Reported on 06/02/2024   Bernardo Fend, DO  Active   naproxen  (NAPROSYN ) 500 MG tablet [id number]  Take 1 tablet (500 mg total) by mouth 2 (two) times daily as needed for moderate pain (pain score 4-6). Bernardo Fend, DO  Active   nicotine  (NICODERM CQ  - DOSED IN MG/24 HOURS) 14 mg/24hr patch [id number]  Place 1 patch (14 mg total) onto the skin daily.  Patient not taking: Reported on 06/02/2024   Bernardo Fend, DO  Active   Semaglutide -Weight Management (WEGOVY ) 0.5 MG/0.5ML EMMANUEL [id number]  Inject 0.5 mg into the skin once a week.  Patient not taking: Reported on 06/02/2024   Bernardo Fend, DO  Active             Recommendation:   PCP Follow-up Specialty provider follow-up GYN 12/9 Continue Current Plan of Care Call PCP to schedule follow up, refill and pick up medications asap  Follow Up Plan:   Telephone follow-up in 1 month  Lands Devra, MSN, RN [facility]  [facility], [facility] RN Care Manager Direct Dial: [phone] Fax: [phone]

## 2024-06-03 NOTE — Patient Instructions (Signed)
 Visit Information  Thank you for taking time to visit with me today. Please don't hesitate to contact me if I can be of assistance to you before our next scheduled appointment.  Our next appointment is by telephone on 06/24/24 at 1pm Please call the care guide team at [phone] if you need to cancel or reschedule your appointment.   Following is a copy of your care plan:   Goals Addressed             This Visit's Progress    [facility] Social Work Care Plan       Problems:   Lacks knowledge of how to connect to a mental health provider to address symptoms of depression  CSW Clinical Goal(s):   Over the next 90 days the Patient will work with [employer] to address needs related to ongoing mental health follow up.  Interventions:  Mental Health:  Evaluation of current treatment plan related to depression Discussed referral options to connect for ongoing therapy: patient agreeable to referral to [facility] for ongoing mental health support PHQ2/PHQ9 completed   Patient Goals/Self-Care Activities:  Schedule initial appointment with [facility] once contacted  Plan:   Telephone follow up appointment with care management team member scheduled for:  06/24/24 1pm        Please call the Suicide and Crisis Lifeline: 988 call the [place]  National Suicide Prevention Lifeline: 740 625 3473 or TTY: 816-305-9604 TTY 612-262-8388) to talk to a trained counselor call 1-800-273-TALK (toll free, 24 hour hotline) call 911 if you are experiencing a Mental Health or Behavioral Health Crisis or need someone to talk to.  Patient verbalizes understanding of instructions and care plan provided today and agrees to view in MyChart. Active MyChart status and patient understanding of how to access instructions and care plan via MyChart confirmed with patient.     Wessie Shanks, LCSW [place]  [facility], [facility] Licensed Clinical Social Worker  Direct  Dial: [phone]

## 2024-06-03 NOTE — Patient Outreach (Signed)
 Complex Care Management   Visit Note  06/03/2024  Name:  Jill Kelly MRN: [id number] DOB: [birth year]  Situation: Referral received for Complex Care Management related to Mental/Behavioral Health diagnosis depression I obtained verbal consent from Patient.  Visit completed with Patient  on the phone on 06/02/24  Background:   Past Medical History:  Diagnosis Date   Fibroids    uterine fibroids- dx 09/2021   Medical history non-contributory    Postpartum hypertension 10/22/2022    Assessment: Patient Reported Symptoms:  Cognitive Cognitive Status: Alert and oriented to person, place, and time, Insightful and able to interpret abstract concepts, Normal speech and language skills Cognitive/Intellectual Conditions Management [RPT]: None reported or documented in medical history or problem list   Health Maintenance Behaviors: Annual physical exam Healing Pattern: Average  Neurological Neurological Review of Symptoms: No symptoms reported    HEENT HEENT Symptoms Reported: No symptoms reported      Cardiovascular Cardiovascular Symptoms Reported: No symptoms reported    Respiratory Respiratory Symptoms Reported: No symptoms reported    Endocrine Endocrine Symptoms Reported: No symptoms reported    Gastrointestinal Gastrointestinal Symptoms Reported: Constipation Additional Gastrointestinal Details: Stopped wegovy , uses miralx from time to time      Genitourinary Additional Genitourinary Details: new gynecologist in [place] [address] [place]  07/27/24 9:15am    Integumentary Integumentary Symptoms Reported: No symptoms reported    Musculoskeletal Musculoskelatal Symptoms Reviewed: No symptoms reported        Psychosocial Psychosocial Symptoms Reported: Depression - if selected complete PHQ 2-9 Additional Psychological Details: patient guarded-confirmed depression and agreed to ongong mental health follow up          06/03/2024    PHQ2-9 Depression  Screening   Little interest or pleasure in doing things More than half the days  Feeling down, depressed, or hopeless More than half the days  PHQ-2 - Total Score 4  Trouble falling or staying asleep, or sleeping too much Nearly every day  Feeling tired or having little energy Nearly every day  Poor appetite or overeating  More than half the days  Feeling bad about yourself - or that you are a failure or have let yourself or your family down Nearly every day  Trouble concentrating on things, such as reading the newspaper or watching television Nearly every day  Moving or speaking so slowly that other people could have noticed.  Or the opposite - being so fidgety or restless that you have been moving around a lot more than usual Not at all  Thoughts that you would be better off dead, or hurting yourself in some way Not at all  PHQ2-9 Total Score 18  If you checked off any problems, how difficult have these problems made it for you to do your work, take care of things at home, or get along with other people Very difficult  Depression Interventions/Treatment      There were no vitals filed for this visit.  Medications Reviewed Today     Reviewed by Ermalinda Lenn HERO, LCSW (Social Worker) on 06/03/24 at 1245  Med List Status: <None>   Medication Order Taking? Sig Documenting Provider Last Dose Status Informant  escitalopram  (LEXAPRO ) 10 MG tablet [id number]  Take 1 tablet (10 mg total) by mouth daily.  Patient not taking: Reported on 06/02/2024   Bernardo Fend, DO  Active   hydrOXYzine  (ATARAX ) 10 MG tablet [id number]  Take 1 tablet (10 mg total) by mouth at bedtime as needed for  anxiety.  Patient not taking: Reported on 06/02/2024   Bernardo Fend, DO  Active   naproxen  (NAPROSYN ) 500 MG tablet [id number] Yes Take 1 tablet (500 mg total) by mouth 2 (two) times daily as needed for moderate pain (pain score 4-6).  Patient not taking: Reported on 06/02/2024   Bernardo Fend, DO   Active   nicotine  (NICODERM CQ  - DOSED IN MG/24 HOURS) 14 mg/24hr patch [id number]  Place 1 patch (14 mg total) onto the skin daily.  Patient not taking: Reported on 06/02/2024   Bernardo Fend, DO  Active   Semaglutide -Weight Management (WEGOVY ) 0.5 MG/0.5ML EMMANUEL [id number]  Inject 0.5 mg into the skin once a week.  Patient not taking: Reported on 06/02/2024   Bernardo Fend, DO  Active             Recommendation:   PCP Follow-up Ongoing mental health counseling  Follow Up Plan:   Telephone follow up appointment date/time:  06/24/24 . Evia Goldsmith, LCSW [place]  [facility], [facility] Licensed Clinical Social Worker  Direct Dial: [phone]

## 2024-06-04 ENCOUNTER — Other Ambulatory Visit (HOSPITAL_COMMUNITY): Payer: Self-pay

## 2024-06-04 ENCOUNTER — Telehealth: Payer: Self-pay | Admitting: Pharmacy Technician

## 2024-06-04 NOTE — Telephone Encounter (Signed)
 Pharmacy Patient Advocate Encounter   Received notification from CoverMyMeds that renewal prior authorization for Wegovy  0.25MG /0.5ML auto-injectors is required/requested.   Insurance verification completed.   The patient is insured through [employer].   Per test claim: Effective October 1st, Medicaid will discontinue coverage of GLP1 medications for weight loss (such as Wegovy  and Zepbound ), unless the patient has a documented history of a heart attack or stroke. Zepbound  will continue to be covered only for patients with moderate to severe sleep apnea (AHI 15-30) and a BMI greater than 40. Because of this change, the prior authorization team will not be submitting new PA requests for GLP1 medications prescribed for weight loss, as patients will be unable to continue therapy under Medicaid coverage.

## 2024-06-24 ENCOUNTER — Encounter: Payer: Self-pay | Admitting: *Deleted

## 2024-06-24 ENCOUNTER — Telehealth: Payer: Self-pay | Admitting: *Deleted

## 2024-06-24 NOTE — Patient Instructions (Signed)
 Amea Rojean Shoulder - I am sorry I was unable to reach you today. I work with Bernardo Fend, DO and am calling to support your healthcare needs. Please contact me at [phone] at your earliest convenience. I look forward to speaking with you soon.   Thank you,    Issabelle Mcraney, LCSW [place]  [facility], [facility] Licensed Clinical Social Worker  Direct Dial: [phone]

## 2024-06-30 ENCOUNTER — Telehealth: Payer: Self-pay

## 2024-06-30 NOTE — Patient Instructions (Signed)
 Jill Kelly Shoulder - I am sorry I was unable to reach you today for our scheduled appointment. I work with Bernardo Fend, DO and am calling to support your healthcare needs. Please contact me at [phone] at your earliest convenience. I look forward to speaking with you soon.   Thank you,  Nestora Duos, MSN, RN [facility]  [facility], [facility] Health RN Care Manager Direct Dial: [phone] Fax: [phone]

## 2024-07-08 NOTE — Patient Outreach (Signed)
 RNCM - 3rd unsuccessful call  by [facility] to reschedule missed appointment. Left VM explaining no further calls will be made by [facility], can call me to schedule if would like to continue services. PCP and LCSW notified and reassigned to LCSW.

## 2024-07-19 ENCOUNTER — Telehealth: Payer: Self-pay | Admitting: *Deleted

## 2024-07-19 ENCOUNTER — Encounter: Payer: Self-pay | Admitting: *Deleted

## 2024-07-19 NOTE — Patient Instructions (Signed)
 Amea Rojean Shoulder - I am sorry I was unable to reach you today. I work with Bernardo Fend, DO and am calling to support your healthcare needs. Please contact me at [phone] at your earliest convenience. I look forward to speaking with you soon.   Thank you,    Issabelle Mcraney, LCSW [place]  [facility], [facility] Licensed Clinical Social Worker  Direct Dial: [phone]

## 2024-07-27 ENCOUNTER — Ambulatory Visit: Admitting: Family Medicine

## 2024-07-27 ENCOUNTER — Encounter: Payer: Self-pay | Admitting: Family Medicine

## 2024-07-27 VITALS — BP 123/75 | HR 85 | Wt 282.0 lb

## 2024-07-27 DIAGNOSIS — D259 Leiomyoma of uterus, unspecified: Secondary | ICD-10-CM | POA: Insufficient documentation

## 2024-07-27 DIAGNOSIS — Z1331 Encounter for screening for depression: Secondary | ICD-10-CM

## 2024-07-27 DIAGNOSIS — N926 Irregular menstruation, unspecified: Secondary | ICD-10-CM

## 2024-07-27 LAB — POCT URINE PREGNANCY: Preg Test, Ur: NEGATIVE

## 2024-07-27 NOTE — Progress Notes (Signed)
   Subjective:    Patient ID: Jill Kelly is a 33 y.o. female presenting with No chief complaint on file.  on 07/27/2024  HPI: Has h/o fibroids. She is s/p 1 c-section.  She has very painful periods. Has had these for several years.  Take medication around the clock during her c-section. Takes Aleve  dual action + tylenol . Does not really help. Does not want more kids. Cycles are irregular. Cycles are heavy and last 7-10 days.    Review of Systems  Constitutional:  Negative for chills and fever.  Respiratory:  Negative for shortness of breath.   Cardiovascular:  Negative for chest pain.  Gastrointestinal:  Negative for abdominal pain, nausea and vomiting.  Genitourinary:  Negative for dysuria.  Skin:  Negative for rash.      Objective:    BP 123/75   Pulse 85   Wt 282 lb (127.9 kg)   LMP 06/07/2024 (Approximate)   Breastfeeding No   BMI 51.58 kg/m  Physical Exam Exam conducted with a chaperone present.  Constitutional:      General: She is not in acute distress.    Appearance: She is well-developed.  HENT:     Head: Normocephalic and atraumatic.  Eyes:     General: No scleral icterus. Cardiovascular:     Rate and Rhythm: Normal rate.  Pulmonary:     Effort: Pulmonary effort is normal.  Abdominal:     Palpations: Abdomen is soft.  Musculoskeletal:     Cervical back: Neck supple.  Skin:    General: Skin is warm and dry.  Neurological:     Mental Status: She is alert and oriented to person, place, and time.         Assessment & Plan:   Problem List Items Addressed This Visit       Unprioritized   Fibroid uterus   Offered conservative treatment, but pt. Declined. Wants hysterectomy. Check u/s. Risks include but are not limited to bleeding, infection, injury to surrounding structures, including bowel, bladder and ureters, blood clots, and death.  Likelihood of success is high.       Relevant Orders   US  PELVIC COMPLETE WITH TRANSVAGINAL    Ambulatory Referral For Surgery Scheduling   Other Visit Diagnoses       Missed period    -  Primary   Relevant Orders   POCT urine pregnancy (Completed)     Positive screening for depression on 9-item Patient Health Questionnaire (PHQ-9)       referred to [facility]   Relevant Orders   Ambulatory referral to Integrated Behavioral Health        Return for needs U/S.  Jill GORMAN Birk, MD 07/27/2024 11:24 AM

## 2024-07-27 NOTE — Assessment & Plan Note (Signed)
 Offered conservative treatment, but pt. Declined. Wants hysterectomy. Check u/s. Risks include but are not limited to bleeding, infection, injury to surrounding structures, including bowel, bladder and ureters, blood clots, and death.  Likelihood of success is high.

## 2024-07-28 ENCOUNTER — Telehealth: Payer: Self-pay | Admitting: *Deleted

## 2024-07-28 NOTE — Telephone Encounter (Signed)
 Called pt to ask her to come by the office to re-sign the hysterectomy consent as the one she signed has a patient sticker on it. Pt said she will come by the office.

## 2024-07-29 ENCOUNTER — Encounter: Admitting: Licensed Clinical Social Worker

## 2024-08-03 ENCOUNTER — Ambulatory Visit

## 2024-08-10 ENCOUNTER — Telehealth: Payer: Self-pay | Admitting: *Deleted

## 2024-08-11 ENCOUNTER — Other Ambulatory Visit: Payer: Self-pay | Admitting: *Deleted

## 2024-08-13 NOTE — Patient Instructions (Signed)
 Visit Information  Thank you for taking time to visit with me today. Please don't hesitate to contact me if I can be of assistance to you before our next scheduled appointment.  Your next care management appointment is by telephone on 08/27/24 at 9am    Please call the care guide team at [phone] if you need to cancel, schedule, or reschedule an appointment.   Please call the Suicide and Crisis Lifeline: 988 call the [place]  National Suicide Prevention Lifeline: 740-175-7092 or TTY: 571-014-1025 TTY 801-599-1293) to talk to a trained counselor call 1-800-273-TALK (toll free, 24 hour hotline) call 911 if you are experiencing a Mental Health or Behavioral Health Crisis or need someone to talk to.  Estle Huguley, LCSW [place]  [facility], [facility] Health Licensed Clinical Social Worker  Direct Dial: [phone]

## 2024-08-13 NOTE — Patient Outreach (Signed)
 Complex Care Management   Visit Note  08/13/2024  Name:  Jill Kelly MRN: [id number] DOB: Jan 16, 1991  Situation: Referral received for Complex Care Management related to Mental/Behavioral Health diagnosis depression and anxiety I obtained verbal consent from Patient.  Visit completed with Patient  on the phone on 08/11/24.  Background:   Past Medical History:  Diagnosis Date   Fibroids    uterine fibroids- dx 09/2021   Medical history non-contributory    Postpartum hypertension 10/22/2022    Assessment: Patient continues to report challenges managing depression and anxiety. Overwhelmed, concerns discussed related to childcare issues. Agreeable to peer support and ongoing counseling Patient Reported Symptoms:  Cognitive Cognitive Status: Alert and oriented to person, place, and time, Normal speech and language skills Cognitive/Intellectual Conditions Management [RPT]: None reported or documented in medical history or problem list   Health Maintenance Behaviors: Annual physical exam Healing Pattern: Average Health Facilitated by: Rest  Neurological Neurological Review of Symptoms: No symptoms reported    HEENT HEENT Symptoms Reported: No symptoms reported      Cardiovascular Cardiovascular Symptoms Reported: No symptoms reported    Respiratory Respiratory Symptoms Reported: No symptoms reported    Endocrine Endocrine Symptoms Reported: No symptoms reported Is patient diabetic?: No    Gastrointestinal Gastrointestinal Symptoms Reported: Constipation Additional Gastrointestinal Details: uses miralax for constipation      Genitourinary Genitourinary Symptoms Reported: No symptoms reported    Integumentary Integumentary Symptoms Reported: No symptoms reported    Musculoskeletal Musculoskelatal Symptoms Reviewed: No symptoms reported        Psychosocial Psychosocial Symptoms Reported: Anxiety - if selected complete GAD, Depression - if selected complete PHQ  2-9 Additional Psychological Details: conitnued depression and anxiety, agreeable to ongoing counseling, continues to have challenges with childcare and subsequent missed appointments, overwhelmed with childcare, will discuss possible alternatives Behavioral Management Strategies: Coping strategies, Counseling Behavioral Health Comment: CSW to refer for ongoing counseling and peer support Major Change/Loss/Stressor/Fears (CP): Medical condition, self, School or job Techniques to [employer] with Loss/Stress/Change: Withdraw Quality of Family Relationships: non-existent    08/13/2024    PHQ2-9 Depression Screening   Little interest or pleasure in doing things    Feeling down, depressed, or hopeless    PHQ-2 - Total Score    Trouble falling or staying asleep, or sleeping too much    Feeling tired or having little energy    Poor appetite or overeating     Feeling bad about yourself - or that you are a failure or have let yourself or your family down    Trouble concentrating on things, such as reading the newspaper or watching television    Moving or speaking so slowly that other people could have noticed.  Or the opposite - being so fidgety or restless that you have been moving around a lot more than usual    Thoughts that you would be better off dead, or hurting yourself in some way    PHQ2-9 Total Score    If you checked off any problems, how difficult have these problems made it for you to do your work, take care of things at home, or get along with other people    Depression Interventions/Treatment      There were no vitals filed for this visit.    Medications Reviewed Today     Reviewed by Ermalinda Lenn HERO, LCSW (Social Worker) on 08/11/24 at 1138  Med List Status: <None>   Medication Order Taking? Sig Documenting Provider Last Dose Status  Informant  escitalopram  (LEXAPRO ) 10 MG tablet [id number]  Take 1 tablet (10 mg total) by mouth daily.  Patient not taking: Reported on 08/11/2024    Bernardo Fend, DO  Active   hydrOXYzine  (ATARAX ) 10 MG tablet [id number]  Take 1 tablet (10 mg total) by mouth at bedtime as needed for anxiety.  Patient not taking: Reported on 08/11/2024   Bernardo Fend, DO  Active   naproxen  (NAPROSYN ) 500 MG tablet [id number]  Take 1 tablet (500 mg total) by mouth 2 (two) times daily as needed for moderate pain (pain score 4-6).  Patient not taking: Reported on 08/11/2024   Bernardo Fend, DO  Active   nicotine  (NICODERM CQ  - DOSED IN MG/24 HOURS) 14 mg/24hr patch [id number]  Place 1 patch (14 mg total) onto the skin daily.  Patient not taking: Reported on 08/11/2024   Bernardo Fend, DO  Active   Semaglutide -Weight Management (WEGOVY ) 0.5 MG/0.5ML EMMANUEL [id number]  Inject 0.5 mg into the skin once a week.  Patient not taking: Reported on 08/11/2024   Bernardo Fend, DO  Active             Recommendation:   PCP Follow-up Ongoing Mental health follow up  Follow Up Plan:   Telephone follow up appointment date/time:  1/0/26 9am  Leopoldo Mazzie, LCSW [place]  [facility], [facility] Health Licensed Clinical Social Worker  Direct Dial: [phone]

## 2024-08-17 ENCOUNTER — Telehealth: Payer: Self-pay

## 2024-08-17 NOTE — Telephone Encounter (Signed)
 I called patient to schedule surgery with Dr. Fredirick on 09/07/24. Patient prefers to be scheduled late February or March. Advised I would speak with Dr. Fredirick to determine her Feb & March availability.

## 2024-08-27 ENCOUNTER — Encounter: Payer: Self-pay | Admitting: *Deleted

## 2024-08-27 ENCOUNTER — Telehealth: Payer: Self-pay | Admitting: *Deleted

## 2024-08-27 NOTE — Patient Instructions (Signed)
 Jill Kelly Shoulder - I am sorry I was unable to reach you today for our scheduled appointment. I work with Bernardo Fend, DO and am calling to support your healthcare needs. Please contact me at [phone] at your earliest convenience. I look forward to speaking with you soon.   Thank you,    Christophe Rising, LCSW [place]  [facility], [facility] Health Licensed Clinical Social Worker  Direct Dial: [phone]
